# Patient Record
Sex: Female | Born: 1935 | Marital: Married | Smoking: Never smoker
Health system: Southern US, Community
[De-identification: ages and names within clinical notes are randomized; demographics above are authoritative.]

## PROBLEM LIST (undated history)

## (undated) DIAGNOSIS — E039 Hypothyroidism, unspecified: Secondary | ICD-10-CM

## (undated) DIAGNOSIS — I10 Essential (primary) hypertension: Secondary | ICD-10-CM

## (undated) DIAGNOSIS — M179 Osteoarthritis of knee, unspecified: Secondary | ICD-10-CM

## (undated) DIAGNOSIS — E785 Hyperlipidemia, unspecified: Secondary | ICD-10-CM

## (undated) DIAGNOSIS — M1710 Unilateral primary osteoarthritis, unspecified knee: Secondary | ICD-10-CM

## (undated) DIAGNOSIS — Z8619 Personal history of other infectious and parasitic diseases: Secondary | ICD-10-CM

## (undated) DIAGNOSIS — K449 Diaphragmatic hernia without obstruction or gangrene: Secondary | ICD-10-CM

## (undated) DIAGNOSIS — K219 Gastro-esophageal reflux disease without esophagitis: Secondary | ICD-10-CM

## (undated) HISTORY — PX: TUBAL LIGATION: SHX77

## (undated) HISTORY — PX: LAPAROSCOPIC CHOLECYSTECTOMY: SUR755

## (undated) HISTORY — PX: JOINT REPLACEMENT: SHX530

## (undated) HISTORY — PX: TOTAL KNEE ARTHROPLASTY: SHX125

## (undated) HISTORY — DX: Diaphragmatic hernia without obstruction or gangrene: K44.9

## (undated) HISTORY — DX: Gastro-esophageal reflux disease without esophagitis: K21.9

## (undated) HISTORY — PX: OVARIAN CYST REMOVAL: SHX89

## (undated) HISTORY — PX: APPENDECTOMY: SHX54

## (undated) HISTORY — DX: Personal history of other infectious and parasitic diseases: Z86.19

## (undated) HISTORY — DX: Hyperlipidemia, unspecified: E78.5

## (undated) HISTORY — PX: ANKLE SURGERY: SHX546

---

## 2018-07-10 ENCOUNTER — Emergency Department (HOSPITAL_COMMUNITY): Payer: Medicare Other | Admitting: Certified Registered"

## 2018-07-10 ENCOUNTER — Ambulatory Visit (INDEPENDENT_AMBULATORY_CARE_PROVIDER_SITE_OTHER)
Admission: EM | Admit: 2018-07-10 | Discharge: 2018-07-10 | Disposition: A | Discharge status: Home / Self Care | Payer: Medicare Other | Source: Home / Self Care

## 2018-07-10 ENCOUNTER — Encounter (HOSPITAL_COMMUNITY)
Admission: EM | Disposition: A | Discharge status: Rehabilitation Facility | Payer: Self-pay | Source: Home / Self Care | Attending: Neurosurgery

## 2018-07-10 ENCOUNTER — Encounter (HOSPITAL_COMMUNITY): Payer: Self-pay

## 2018-07-10 ENCOUNTER — Inpatient Hospital Stay (HOSPITAL_COMMUNITY)
Admission: EM | Admit: 2018-07-10 | Discharge: 2018-07-14 | DRG: 026 | Disposition: A | Discharge status: Rehabilitation Facility | Payer: Medicare Other | Attending: Neurosurgery | Admitting: Neurosurgery

## 2018-07-10 ENCOUNTER — Other Ambulatory Visit: Payer: Self-pay

## 2018-07-10 ENCOUNTER — Emergency Department (HOSPITAL_COMMUNITY): Payer: Medicare Other

## 2018-07-10 DIAGNOSIS — E876 Hypokalemia: Secondary | ICD-10-CM | POA: Diagnosis present

## 2018-07-10 DIAGNOSIS — S065X0S Traumatic subdural hemorrhage without loss of consciousness, sequela: Secondary | ICD-10-CM | POA: Diagnosis not present

## 2018-07-10 DIAGNOSIS — Z888 Allergy status to other drugs, medicaments and biological substances status: Secondary | ICD-10-CM

## 2018-07-10 DIAGNOSIS — R531 Weakness: Secondary | ICD-10-CM

## 2018-07-10 DIAGNOSIS — D72829 Elevated white blood cell count, unspecified: Secondary | ICD-10-CM | POA: Diagnosis present

## 2018-07-10 DIAGNOSIS — I10 Essential (primary) hypertension: Secondary | ICD-10-CM | POA: Diagnosis present

## 2018-07-10 DIAGNOSIS — G9389 Other specified disorders of brain: Secondary | ICD-10-CM | POA: Diagnosis present

## 2018-07-10 DIAGNOSIS — G8194 Hemiplegia, unspecified affecting left nondominant side: Secondary | ICD-10-CM | POA: Diagnosis present

## 2018-07-10 DIAGNOSIS — E785 Hyperlipidemia, unspecified: Secondary | ICD-10-CM | POA: Diagnosis present

## 2018-07-10 DIAGNOSIS — S065XAA Traumatic subdural hemorrhage with loss of consciousness status unknown, initial encounter: Secondary | ICD-10-CM | POA: Diagnosis present

## 2018-07-10 DIAGNOSIS — R110 Nausea: Secondary | ICD-10-CM | POA: Diagnosis not present

## 2018-07-10 DIAGNOSIS — S065X1S Traumatic subdural hemorrhage with loss of consciousness of 30 minutes or less, sequela: Secondary | ICD-10-CM | POA: Diagnosis not present

## 2018-07-10 DIAGNOSIS — Z7982 Long term (current) use of aspirin: Secondary | ICD-10-CM | POA: Diagnosis not present

## 2018-07-10 DIAGNOSIS — E039 Hypothyroidism, unspecified: Secondary | ICD-10-CM | POA: Diagnosis present

## 2018-07-10 DIAGNOSIS — Z8249 Family history of ischemic heart disease and other diseases of the circulatory system: Secondary | ICD-10-CM

## 2018-07-10 DIAGNOSIS — Z9889 Other specified postprocedural states: Secondary | ICD-10-CM

## 2018-07-10 DIAGNOSIS — S06360A Traumatic hemorrhage of cerebrum, unspecified, without loss of consciousness, initial encounter: Secondary | ICD-10-CM

## 2018-07-10 DIAGNOSIS — R29810 Facial weakness: Secondary | ICD-10-CM | POA: Diagnosis present

## 2018-07-10 DIAGNOSIS — S065X9A Traumatic subdural hemorrhage with loss of consciousness of unspecified duration, initial encounter: Principal | ICD-10-CM | POA: Diagnosis present

## 2018-07-10 DIAGNOSIS — Z79890 Hormone replacement therapy (postmenopausal): Secondary | ICD-10-CM

## 2018-07-10 DIAGNOSIS — Z96653 Presence of artificial knee joint, bilateral: Secondary | ICD-10-CM | POA: Diagnosis present

## 2018-07-10 DIAGNOSIS — R296 Repeated falls: Secondary | ICD-10-CM | POA: Diagnosis present

## 2018-07-10 DIAGNOSIS — D72823 Leukemoid reaction: Secondary | ICD-10-CM | POA: Diagnosis not present

## 2018-07-10 DIAGNOSIS — W19XXXA Unspecified fall, initial encounter: Secondary | ICD-10-CM | POA: Diagnosis present

## 2018-07-10 DIAGNOSIS — Z9181 History of falling: Secondary | ICD-10-CM

## 2018-07-10 DIAGNOSIS — E7800 Pure hypercholesterolemia, unspecified: Secondary | ICD-10-CM | POA: Diagnosis present

## 2018-07-10 HISTORY — PX: CRANIOTOMY: SHX93

## 2018-07-10 HISTORY — DX: Essential (primary) hypertension: I10

## 2018-07-10 HISTORY — DX: Hypothyroidism, unspecified: E03.9

## 2018-07-10 HISTORY — DX: Osteoarthritis of knee, unspecified: M17.9

## 2018-07-10 HISTORY — DX: Unilateral primary osteoarthritis, unspecified knee: M17.10

## 2018-07-10 LAB — COMPREHENSIVE METABOLIC PANEL
ALT: 16 U/L (ref 0–44)
AST: 26 U/L (ref 15–41)
Albumin: 3.8 g/dL (ref 3.5–5.0)
Alkaline Phosphatase: 58 U/L (ref 38–126)
Anion gap: 10 (ref 5–15)
BUN: 11 mg/dL (ref 8–23)
CHLORIDE: 101 mmol/L (ref 98–111)
CO2: 27 mmol/L (ref 22–32)
Calcium: 9.8 mg/dL (ref 8.9–10.3)
Creatinine, Ser: 0.84 mg/dL (ref 0.44–1.00)
GFR calc Af Amer: >60 mL/min (ref >60)
GFR calc non Af Amer: >60 mL/min (ref >60)
GLUCOSE: 138 mg/dL — AB (ref 70–99)
Potassium: 3.4 mmol/L — ABNORMAL LOW (ref 3.5–5.1)
Sodium: 138 mmol/L (ref 135–145)
Total Bilirubin: 0.8 mg/dL (ref 0.3–1.2)
Total Protein: 7.1 g/dL (ref 6.5–8.1)

## 2018-07-10 LAB — CBC
HCT: 45.1 % (ref 36.0–46.0)
HEMOGLOBIN: 14.1 g/dL (ref 12.0–15.0)
MCH: 27.9 pg (ref 26.0–34.0)
MCHC: 31.3 g/dL (ref 30.0–36.0)
MCV: 89.3 fL (ref 80.0–100.0)
NRBC: 0 % (ref 0.0–0.2)
Platelets: 302 10*3/uL (ref 150–400)
RBC: 5.05 MIL/uL (ref 3.87–5.11)
RDW: 14 % (ref 11.5–15.5)
WBC: 12.1 10*3/uL — ABNORMAL HIGH (ref 4.0–10.5)

## 2018-07-10 LAB — I-STAT CHEM 8, ED
BUN: 13 mg/dL (ref 8–23)
CREATININE: 0.8 mg/dL (ref 0.44–1.00)
Calcium, Ion: 1.3 mmol/L (ref 1.15–1.40)
Chloride: 100 mmol/L (ref 98–111)
Glucose, Bld: 131 mg/dL — ABNORMAL HIGH (ref 70–99)
HEMATOCRIT: 46 % (ref 36.0–46.0)
Hemoglobin: 15.6 g/dL — ABNORMAL HIGH (ref 12.0–15.0)
Potassium: 3.4 mmol/L — ABNORMAL LOW (ref 3.5–5.1)
Sodium: 138 mmol/L (ref 135–145)
TCO2: 29 mmol/L (ref 22–32)

## 2018-07-10 LAB — PROTIME-INR
INR: 1
Prothrombin Time: 13.1 seconds (ref 11.4–15.2)

## 2018-07-10 LAB — DIFFERENTIAL
ABS IMMATURE GRANULOCYTES: 0.09 10*3/uL — AB (ref 0.00–0.07)
Basophils Absolute: 0 10*3/uL (ref 0.0–0.1)
Basophils Relative: 0 %
Eosinophils Absolute: 0.1 10*3/uL (ref 0.0–0.5)
Eosinophils Relative: 0 %
Immature Granulocytes: 1 %
Lymphocytes Relative: 25 %
Lymphs Abs: 3 10*3/uL (ref 0.7–4.0)
Monocytes Absolute: 0.8 10*3/uL (ref 0.1–1.0)
Monocytes Relative: 7 %
NEUTROS ABS: 8.1 10*3/uL — AB (ref 1.7–7.7)
Neutrophils Relative %: 67 %

## 2018-07-10 LAB — TYPE AND SCREEN
ABO/RH(D): A POS
Antibody Screen: NEGATIVE

## 2018-07-10 LAB — ABO/RH: ABO/RH(D): A POS

## 2018-07-10 LAB — APTT: aPTT: 27 seconds (ref 24–36)

## 2018-07-10 LAB — I-STAT TROPONIN, ED: Troponin i, poc: 0 ng/mL (ref 0.00–0.08)

## 2018-07-10 SURGERY — CRANIOTOMY HEMATOMA EVACUATION SUBDURAL
Anesthesia: General | Site: Head | Laterality: Right

## 2018-07-10 MED ORDER — ACETAMINOPHEN 10 MG/ML IV SOLN
INTRAVENOUS | Status: DC | PRN
  Administered 2018-07-10: 1000 mg via INTRAVENOUS

## 2018-07-10 MED ORDER — ONDANSETRON HCL 4 MG/2ML IJ SOLN
INTRAMUSCULAR | Status: AC
  Filled 2018-07-10: qty 2

## 2018-07-10 MED ORDER — LIDOCAINE-EPINEPHRINE 1 %-1:100000 IJ SOLN
INTRAMUSCULAR | Status: AC
  Filled 2018-07-10: qty 1

## 2018-07-10 MED ORDER — THROMBIN 20000 UNITS EX SOLR
CUTANEOUS | Status: AC
  Filled 2018-07-10: qty 20000

## 2018-07-10 MED ORDER — VITAMIN D 25 MCG (1000 UNIT) PO TABS
2000.0000 [IU] | ORAL_TABLET | Freq: Every day | ORAL | Status: DC
  Administered 2018-07-11 – 2018-07-14 (×4): 2000 [IU] via ORAL
  Filled 2018-07-10 (×4): qty 2

## 2018-07-10 MED ORDER — SUGAMMADEX SODIUM 200 MG/2ML IV SOLN
INTRAVENOUS | Status: DC | PRN
  Administered 2018-07-10: 200 mg via INTRAVENOUS

## 2018-07-10 MED ORDER — PHENYLEPHRINE 40 MCG/ML (10ML) SYRINGE FOR IV PUSH (FOR BLOOD PRESSURE SUPPORT)
PREFILLED_SYRINGE | INTRAVENOUS | Status: AC
  Filled 2018-07-10: qty 10

## 2018-07-10 MED ORDER — FENTANYL CITRATE (PF) 100 MCG/2ML IJ SOLN
INTRAMUSCULAR | Status: DC | PRN
  Administered 2018-07-10 (×2): 50 ug via INTRAVENOUS

## 2018-07-10 MED ORDER — LIDOCAINE-EPINEPHRINE 1 %-1:100000 IJ SOLN
INTRAMUSCULAR | Status: DC | PRN
  Administered 2018-07-10: 6 mL via INTRADERMAL
  Administered 2018-07-10: 20 mL via INTRADERMAL

## 2018-07-10 MED ORDER — SODIUM CHLORIDE 0.9 % IV SOLN
INTRAVENOUS | Status: DC | PRN
  Administered 2018-07-10: 500 mL

## 2018-07-10 MED ORDER — ROCURONIUM BROMIDE 50 MG/5ML IV SOSY
PREFILLED_SYRINGE | INTRAVENOUS | Status: AC
  Filled 2018-07-10: qty 5

## 2018-07-10 MED ORDER — SUGAMMADEX SODIUM 500 MG/5ML IV SOLN
INTRAVENOUS | Status: AC
  Filled 2018-07-10: qty 5

## 2018-07-10 MED ORDER — LIDOCAINE 2% (20 MG/ML) 5 ML SYRINGE
INTRAMUSCULAR | Status: AC
  Filled 2018-07-10: qty 5

## 2018-07-10 MED ORDER — ACETAMINOPHEN 10 MG/ML IV SOLN
INTRAVENOUS | Status: AC
  Filled 2018-07-10: qty 100

## 2018-07-10 MED ORDER — FENTANYL CITRATE (PF) 250 MCG/5ML IJ SOLN
INTRAMUSCULAR | Status: AC
  Filled 2018-07-10: qty 5

## 2018-07-10 MED ORDER — THROMBIN 20000 UNITS EX SOLR
CUTANEOUS | Status: DC | PRN
  Administered 2018-07-10: 20 mL via TOPICAL

## 2018-07-10 MED ORDER — SODIUM CHLORIDE 0.9 % IV SOLN
INTRAVENOUS | Status: DC | PRN
  Administered 2018-07-10: 22:00:00 via INTRAVENOUS

## 2018-07-10 MED ORDER — ROCURONIUM BROMIDE 50 MG/5ML IV SOSY
PREFILLED_SYRINGE | INTRAVENOUS | Status: DC | PRN
  Administered 2018-07-10: 50 mg via INTRAVENOUS

## 2018-07-10 MED ORDER — LIDOCAINE 2% (20 MG/ML) 5 ML SYRINGE
INTRAMUSCULAR | Status: DC | PRN
  Administered 2018-07-10: 60 mg via INTRAVENOUS

## 2018-07-10 MED ORDER — CLEVIDIPINE BUTYRATE 0.5 MG/ML IV EMUL
0.0000 mg/h | INTRAVENOUS | Status: DC
  Administered 2018-07-10: 1 mg/h via INTRAVENOUS
  Filled 2018-07-10: qty 50

## 2018-07-10 MED ORDER — PHENYLEPHRINE HCL 10 MG/ML IJ SOLN
INTRAMUSCULAR | Status: DC | PRN
  Administered 2018-07-10 (×2): 80 ug via INTRAVENOUS
  Administered 2018-07-10: 40 ug via INTRAVENOUS

## 2018-07-10 MED ORDER — ONDANSETRON HCL 4 MG/2ML IJ SOLN
INTRAMUSCULAR | Status: DC | PRN
  Administered 2018-07-10: 4 mg via INTRAVENOUS

## 2018-07-10 MED ORDER — POVIDONE-IODINE 10 % EX OINT
TOPICAL_OINTMENT | CUTANEOUS | Status: AC
  Filled 2018-07-10: qty 28.35

## 2018-07-10 MED ORDER — PROPOFOL 10 MG/ML IV BOLUS
INTRAVENOUS | Status: AC
  Filled 2018-07-10: qty 20

## 2018-07-10 MED ORDER — 0.9 % SODIUM CHLORIDE (POUR BTL) OPTIME
TOPICAL | Status: DC | PRN
  Administered 2018-07-10 (×2): 1000 mL

## 2018-07-10 MED ORDER — BUPIVACAINE HCL (PF) 0.25 % IJ SOLN
INTRAMUSCULAR | Status: AC
  Filled 2018-07-10: qty 30

## 2018-07-10 MED ORDER — SODIUM CHLORIDE 0.9 % IV SOLN
2.0000 g | Freq: Once | INTRAVENOUS | Status: AC
  Administered 2018-07-10: 2 g via INTRAVENOUS
  Filled 2018-07-10: qty 20

## 2018-07-10 MED ORDER — MICROFIBRILLAR COLL HEMOSTAT EX PADS
MEDICATED_PAD | CUTANEOUS | Status: DC | PRN
  Administered 2018-07-10: 1 via TOPICAL

## 2018-07-10 MED ORDER — THROMBIN 5000 UNITS EX SOLR
CUTANEOUS | Status: AC
  Filled 2018-07-10: qty 5000

## 2018-07-10 MED ORDER — METHYLENE BLUE 0.5 % INJ SOLN
INTRAVENOUS | Status: AC
  Filled 2018-07-10: qty 10

## 2018-07-10 MED ORDER — LEVOTHYROXINE SODIUM 50 MCG PO TABS
50.0000 ug | ORAL_TABLET | Freq: Every day | ORAL | Status: DC
  Administered 2018-07-11 – 2018-07-14 (×4): 50 ug via ORAL
  Filled 2018-07-10 (×4): qty 1

## 2018-07-10 MED ORDER — METHYLENE BLUE 0.5 % INJ SOLN
INTRAVENOUS | Status: DC | PRN
  Administered 2018-07-10: 1 mL

## 2018-07-10 MED ORDER — PROPOFOL 10 MG/ML IV BOLUS
INTRAVENOUS | Status: DC | PRN
  Administered 2018-07-10: 100 mg via INTRAVENOUS

## 2018-07-10 MED ORDER — SIMVASTATIN 20 MG PO TABS
20.0000 mg | ORAL_TABLET | Freq: Every day | ORAL | Status: DC
  Administered 2018-07-11 – 2018-07-14 (×4): 20 mg via ORAL
  Filled 2018-07-10 (×4): qty 1

## 2018-07-10 MED ORDER — LISINOPRIL 10 MG PO TABS
10.0000 mg | ORAL_TABLET | Freq: Every day | ORAL | Status: DC
  Administered 2018-07-11 – 2018-07-14 (×4): 10 mg via ORAL
  Filled 2018-07-10 (×4): qty 1

## 2018-07-10 SURGICAL SUPPLY — 75 items
APPLICATOR COTTON TIP 6IN STRL (MISCELLANEOUS) ×2 IMPLANT
BAG DECANTER FOR FLEXI CONT (MISCELLANEOUS) ×2 IMPLANT
BANDAGE GAUZE 4  KLING STR (GAUZE/BANDAGES/DRESSINGS) IMPLANT
BIT DRILL WIRE PASS 1.3MM (BIT) ×1 IMPLANT
BNDG GAUZE ELAST 4 BULKY (GAUZE/BANDAGES/DRESSINGS) ×4 IMPLANT
BNDG STRETCH 4X75 NS LF (GAUZE/BANDAGES/DRESSINGS) ×2 IMPLANT
BUR ACORN 6.0 PRECISION (BURR) ×2 IMPLANT
BUR SPIRAL ROUTER 2.3 (BUR) ×2 IMPLANT
CANISTER SUCT 3000ML PPV (MISCELLANEOUS) ×2 IMPLANT
CARTRIDGE OIL MAESTRO DRILL (MISCELLANEOUS) ×1 IMPLANT
CLIP VESOCCLUDE MED 6/CT (CLIP) ×2 IMPLANT
COVER WAND RF STERILE (DRAPES) ×2 IMPLANT
DIFFUSER DRILL AIR PNEUMATIC (MISCELLANEOUS) ×2 IMPLANT
DRAIN JACKSON PRATT 10MM FLAT (MISCELLANEOUS) ×2 IMPLANT
DRAIN PENROSE 1/2X12 LTX STRL (WOUND CARE) IMPLANT
DRAPE NEUROLOGICAL W/INCISE (DRAPES) ×2 IMPLANT
DRAPE SURG 17X23 STRL (DRAPES) IMPLANT
DRAPE WARM FLUID 44X44 (DRAPE) ×2 IMPLANT
DRILL WIRE PASS 1.3MM (BIT) ×2
DRSG ADAPTIC 3X8 NADH LF (GAUZE/BANDAGES/DRESSINGS) ×2 IMPLANT
DRSG PAD ABDOMINAL 8X10 ST (GAUZE/BANDAGES/DRESSINGS) IMPLANT
ELECT CAUTERY BLADE 6.4 (BLADE) ×2 IMPLANT
ELECT REM PT RETURN 9FT ADLT (ELECTROSURGICAL) ×2
ELECTRODE REM PT RTRN 9FT ADLT (ELECTROSURGICAL) ×1 IMPLANT
EVACUATOR SILICONE 100CC (DRAIN) ×2 IMPLANT
GAUZE 4X4 16PLY RFD (DISPOSABLE) IMPLANT
GAUZE SPONGE 4X4 12PLY STRL (GAUZE/BANDAGES/DRESSINGS) ×4 IMPLANT
GLOVE BIO SURGEON STRL SZ7 (GLOVE) ×2 IMPLANT
GLOVE BIOGEL PI IND STRL 7.0 (GLOVE) ×2 IMPLANT
GLOVE BIOGEL PI IND STRL 7.5 (GLOVE) ×1 IMPLANT
GLOVE BIOGEL PI IND STRL 8 (GLOVE) ×1 IMPLANT
GLOVE BIOGEL PI INDICATOR 7.0 (GLOVE) ×2
GLOVE BIOGEL PI INDICATOR 7.5 (GLOVE) ×1
GLOVE BIOGEL PI INDICATOR 8 (GLOVE) ×1
GLOVE ECLIPSE 7.5 STRL STRAW (GLOVE) ×2 IMPLANT
GLOVE EXAM NITRILE XL STR (GLOVE) IMPLANT
GOWN STRL REUS W/ TWL LRG LVL3 (GOWN DISPOSABLE) ×2 IMPLANT
GOWN STRL REUS W/ TWL XL LVL3 (GOWN DISPOSABLE) IMPLANT
GOWN STRL REUS W/TWL 2XL LVL3 (GOWN DISPOSABLE) IMPLANT
GOWN STRL REUS W/TWL LRG LVL3 (GOWN DISPOSABLE) ×2
GOWN STRL REUS W/TWL XL LVL3 (GOWN DISPOSABLE)
GRAFT DURAGEN MATRIX 2WX2L ×2 IMPLANT
HEMOSTAT SURGICEL 2X14 (HEMOSTASIS) ×2 IMPLANT
KIT BASIN OR (CUSTOM PROCEDURE TRAY) ×2 IMPLANT
KIT TURNOVER KIT B (KITS) ×2 IMPLANT
NEEDLE SPNL 22GX3.5 QUINCKE BK (NEEDLE) ×2 IMPLANT
NS IRRIG 1000ML POUR BTL (IV SOLUTION) ×2 IMPLANT
OIL CARTRIDGE MAESTRO DRILL (MISCELLANEOUS) ×2
PACK CRANIOTOMY CUSTOM (CUSTOM PROCEDURE TRAY) ×2 IMPLANT
PAD ARMBOARD 7.5X6 YLW CONV (MISCELLANEOUS) ×2 IMPLANT
PATTIES SURGICAL .5 X.5 (GAUZE/BANDAGES/DRESSINGS) IMPLANT
PATTIES SURGICAL .5 X3 (DISPOSABLE) IMPLANT
PATTIES SURGICAL 1/4 X 3 (GAUZE/BANDAGES/DRESSINGS) ×2 IMPLANT
PATTIES SURGICAL 1X1 (DISPOSABLE) IMPLANT
PIN MAYFIELD SKULL DISP (PIN) IMPLANT
PLATE 1.5  2HOLE MED NEURO (Plate) ×1 IMPLANT
PLATE 1.5 2HOLE MED NEURO (Plate) ×1 IMPLANT
PLATE 1.5 5HOLE SQUARE (Plate) ×2 IMPLANT
PLATE 1.5/0.5 13MM BURR HOLE (Plate) ×2 IMPLANT
SCREW SELF DRILL HT 1.5/4MM (Screw) ×22 IMPLANT
SPECIMEN JAR SMALL (MISCELLANEOUS) IMPLANT
SPONGE NEURO XRAY DETECT 1X3 (DISPOSABLE) IMPLANT
SPONGE SURGIFOAM ABS GEL 100 (HEMOSTASIS) ×2 IMPLANT
STAPLER SKIN PROX WIDE 3.9 (STAPLE) ×2 IMPLANT
SUT ETHILON 3 0 FSL (SUTURE) IMPLANT
SUT NURALON 4 0 TR CR/8 (SUTURE) ×4 IMPLANT
SUT VIC AB 2-0 CP2 18 (SUTURE) ×4 IMPLANT
SYR CONTROL 10ML LL (SYRINGE) ×2 IMPLANT
TAPE CLOTH 2X10 TAN LF (GAUZE/BANDAGES/DRESSINGS) ×2 IMPLANT
TOWEL GREEN STERILE (TOWEL DISPOSABLE) ×2 IMPLANT
TOWEL GREEN STERILE FF (TOWEL DISPOSABLE) ×2 IMPLANT
TRAP SPECIMEN MUCOUS 40CC (MISCELLANEOUS) IMPLANT
TRAY FOLEY MTR SLVR 16FR STAT (SET/KITS/TRAYS/PACK) IMPLANT
UNDERPAD 30X30 (UNDERPADS AND DIAPERS) IMPLANT
WATER STERILE IRR 1000ML POUR (IV SOLUTION) ×2 IMPLANT

## 2018-07-10 NOTE — Anesthesia Preprocedure Evaluation (Addendum)
Anesthesia Evaluation  Patient identified by MRN, date of birth, ID band Patient awake    Reviewed: Allergy & Precautions, NPO status , Patient's Chart, lab work & pertinent test results  History of Anesthesia Complications Negative for: history of anesthetic complications  Airway Mallampati: II  TM Distance: >3 FB Neck ROM: Full    Dental  (+) Upper Dentures, Lower Dentures   Pulmonary neg pulmonary ROS,    breath sounds clear to auscultation       Cardiovascular negative cardio ROS   Rhythm:Regular     Neuro/Psych subdural CVA negative psych ROS   GI/Hepatic negative GI ROS, Neg liver ROS,   Endo/Other  Hypothyroidism   Renal/GU negative Renal ROS     Musculoskeletal negative musculoskeletal ROS (+)   Abdominal   Peds  Hematology negative hematology ROS (+)   Anesthesia Other Findings   Reproductive/Obstetrics                            Anesthesia Physical Anesthesia Plan  ASA: II  Anesthesia Plan: General   Post-op Pain Management:    Induction: Intravenous  PONV Risk Score and Plan: 3 and Ondansetron and Dexamethasone  Airway Management Planned: Oral ETT  Additional Equipment: None  Intra-op Plan:   Post-operative Plan: Extubation in OR  Informed Consent: I have reviewed the patients History and Physical, chart, labs and discussed the procedure including the risks, benefits and alternatives for the proposed anesthesia with the patient or authorized representative who has indicated his/her understanding and acceptance.   Dental advisory given  Plan Discussed with: CRNA and Surgeon  Anesthesia Plan Comments:         Anesthesia Quick Evaluation

## 2018-07-10 NOTE — ED Notes (Addendum)
Per Dahlia Byesraci Bast recommendation, patient will be taken to the ER by family members. Family and patient verbalize understanding. NAD during discharge. Pt and daughter is refusing transportation by EMS and insist's on family member taking them to the ER.

## 2018-07-10 NOTE — Op Note (Signed)
07/10/2018  11:51 PM  PATIENT:  Dana Porter  84 y.o. female  PRE-OPERATIVE DIAGNOSIS: Right frontoparietal chronic subdural hematoma  POST-OPERATIVE DIAGNOSIS: Right frontoparietal chronic/subacute subdural hematoma  PROCEDURE:  Procedure(s): Right frontoparietal craniotomy and evacuation of subdural hematoma   SURGEON:  Surgeon(s): Shirlean KellyNudelman, , MD  ANESTHESIA:   general  EBL:  Total I/O In: 1900 [I.V.:1900] Out: 400 [Urine:350; Blood:50]  COUNT:  Correct per nursing staff  DRAINS: 10 mm wide flat [name] in the subdural space  DICTATION:   Patient was brought the operating room, placed under general endotracheal anesthesia.  The right frontoparietal region was shaved with surgical clippers and the head was positioned on a doughnut headrest with a roll behind the right shoulder and the head and neck gently turned towards the left.  The scalp was prepped with Betadine soap and solution draped in a sterile fashion.  A right parasagittal incision was made extending from the frontal boss to the parietal boss.  The line of incision was infiltrated local anesthetic with epinephrine.  Raney clips were applied to the scalp edges to maintain hemostasis.  Temporalis fascia was incised and the temporalis muscle mobilized inferiorly.  2 bur holes were made.  The edge of the bone were waxed as necessary.  The dura was dissected from the overlying skull and then using the craniotome attachment a right frontoparietal bone flap was turned and elevated.  The dura was tacked up to the margins of the craniotomy with 4-0 Nurolon sutures.  The anterior portion of the dura remained adherent to the skull but the subdural membrane was intact.  We opened the remainder of the dura in a U-shaped fashion hinge superiorly.  We then opened the subdural membrane, liquid chronic subdural hematoma drained, and the edge of the subdural membrane was clipped to the edges of the dura with hemoclips.  The  subdural space then irrigated with warm saline to remove the soft chronic/subacute clot that remained.  Continue to irrigate the subdural space until the irrigation ran clear.  We then gently incised the visceral subdural membrane, it was irrigated off the brain surface with warm saline, and then open thoroughly.  The edges of the subdural membrane were coagulated.  We then again irrigated the subdural space extensive with warm saline solution until clear.  A flat [name] was placed in the subdural space and brought out anterior inferiorly through a small scalp incision.  It was sutured to the scalp with a 3-0 nylon suture.  A closed collection system was attached.  A 2 x 2 pledget of DuraGen was placed over the dural defect.  We then secured the bone flap to the skull with 3 Lorenz cranial plates and screws.  We then closed the scalp in layers.  The galea was closed with interrupted inverted 2-0 and 3-0 undyed Vicryl sutures.  The skin was approximated with surgical staples.  The wound was dressed with Adaptic and sterile gauze.  The dressing was wrapped with 2 Kerlix and a Kling.  Following surgery the patient is to be reversed in anesthetic, extubated, and transferred to the recovery room for further care.  PLAN OF CARE: Admit to inpatient   PATIENT DISPOSITION:  PACU - hemodynamically stable.   Delay start of Pharmacological VTE agent (>24hrs) due to surgical blood loss or risk of bleeding:  yes

## 2018-07-10 NOTE — ED Triage Notes (Signed)
Pt here for recurrent falls and left sided weakness.  Noted left arm weakness and no facial asymmetry.  A&Ox4.  Symptoms have been  There since Saturday.

## 2018-07-10 NOTE — Transfer of Care (Signed)
Immediate Anesthesia Transfer of Care Note  Patient: Dana Porter  Procedure(s) Performed: CRANIOTOMY HEMATOMA EVACUATION SUBDURAL (Right Head)  Patient Location: PACU  Anesthesia Type:General  Level of Consciousness: awake, alert  and patient cooperative  Airway & Oxygen Therapy: Patient Spontanous Breathing  Post-op Assessment: Report given to RN and Post -op Vital signs reviewed and stable  Post vital signs: Reviewed and stable  Last Vitals:  Vitals Value Taken Time  BP    Temp    Pulse 76 07/10/2018 11:54 PM  Resp 8 07/10/2018 11:54 PM  SpO2 94 % 07/10/2018 11:54 PM  Vitals shown include unvalidated device data.  Last Pain:  Vitals:   07/10/18 1713  TempSrc:   PainSc: 0-No pain         Complications: No apparent anesthesia complications

## 2018-07-10 NOTE — Anesthesia Procedure Notes (Signed)
Procedure Name: Intubation Date/Time: 07/10/2018 9:55 PM Performed by: Sheppard EvensManess, Finnegan Gatta B, CRNA Pre-anesthesia Checklist: Patient identified, Emergency Drugs available, Suction available and Patient being monitored Patient Re-evaluated:Patient Re-evaluated prior to induction Oxygen Delivery Method: Circle System Utilized Preoxygenation: Pre-oxygenation with 100% oxygen Induction Type: IV induction Ventilation: Mask ventilation without difficulty Tube type: Oral Tube size: 7.5 mm Number of attempts: 1 Airway Equipment and Method: Stylet and Oral airway Placement Confirmation: ETT inserted through vocal cords under direct vision,  positive ETCO2 and breath sounds checked- equal and bilateral Secured at: 20 cm Tube secured with: Tape Dental Injury: Teeth and Oropharynx as per pre-operative assessment

## 2018-07-10 NOTE — ED Provider Notes (Signed)
[facility] EMERGENCY DEPARTMENT Provider Note   CSN: [id number] Arrival date & time: 07/10/18  1557     History   Chief Complaint Chief Complaint  Patient presents with  . Weakness  . Fall    HPI Dana Porter is a 84 y.o. female.  The history is provided by the patient and medical records. No language interpreter was used.  Weakness   Fall    Dana Porter is a 84 y.o. female  with a PMH of HTN, HLD, thyroid disorder who presents to the Emergency Department complaining of left-sided weakness and falls.  Patient states that she initially fell on Saturday.  She states that this was not mechanical, she felt very weak and unsteady as if she could not walk.  She fell.  She denies hitting her head.  Since the fall however, she has had trouble gripping objects and keeps dropping things with her left hand.  She does not feel as if she is having a hard time walking, but her family states that she has been dragging her left foot.  Because of this, she has had 2 subsequent falls.  She is not on any anticoagulation.  History reviewed. No pertinent past medical history.  There are no active problems to display for this patient.   History reviewed. No pertinent surgical history.   OB History   No obstetric history on file.      Home Medications    Prior to Admission medications   Medication Sig Start Date End Date Taking? Authorizing Provider  aspirin 81 MG chewable tablet Chew 162 mg by mouth once as needed (for possible heart-related symptoms).   Yes [provider]  Calcium-Magnesium-Vitamin D (CALCIUM 500 PO) Take 500 mg by mouth daily.   Yes [provider]  Cholecalciferol (VITAMIN D-3) 25 MCG (1000 UT) CAPS Take 2,000 Units by mouth daily with lunch.   Yes [provider]  Garlic 200 MG TABS Take 400 mg by mouth daily with lunch.   Yes [provider]  levothyroxine (SYNTHROID, LEVOTHROID) 50 MCG tablet Take 50 mcg  by mouth daily before breakfast.   Yes [provider]  Magnesium 250 MG TABS Take 250 mg by mouth at bedtime.   Yes [provider]  Multiple Vitamins-Minerals (MULTIVITAMIN WITH MINERALS) tablet Take 1 tablet by mouth daily.   Yes [provider]  Omega-3 Fatty Acids (FISH OIL) 1000 MG CAPS Take 1,000 mg by mouth daily after lunch.   Yes [provider]  quinapril (ACCUPRIL) 10 MG tablet Take 10 mg by mouth at bedtime.   Yes [provider]  simvastatin (ZOCOR) 20 MG tablet Take 20 mg by mouth daily.    [provider]    Family History History reviewed. No pertinent family history.  Social History Social History   Tobacco Use  . Smoking status: Never Smoker  . Smokeless tobacco: Never Used  Substance Use Topics  . Alcohol use: Not on file  . Drug use: Not on file     Allergies   Dyazide [hydrochlorothiazide w-triamterene]   Review of Systems Review of Systems  Neurological: Positive for weakness.  All other systems reviewed and are negative.    Physical Exam Updated Vital Signs BP (!) 141/109   Pulse 75   Temp 98.6 F (37 C) (Oral)   Resp 16   SpO2 94%   Physical Exam Vitals signs and nursing note reviewed.  Constitutional:      General: She  is not in acute distress.    Appearance: She is well-developed.  HENT:     Head: Normocephalic and atraumatic.  Cardiovascular:     Rate and Rhythm: Normal rate and regular rhythm.     Heart sounds: Normal heart sounds. No murmur.  Pulmonary:     Effort: Pulmonary effort is normal. No respiratory distress.     Breath sounds: Normal breath sounds.  Abdominal:     General: There is no distension.     Palpations: Abdomen is soft.     Tenderness: There is no abdominal tenderness.  Musculoskeletal:     Comments: Decreased strength to left upper and lower extremity.  Skin:    General: Skin is warm and dry.  Neurological:     Mental Status: She is alert and oriented  to person, place, and time.     Comments: Facial droop noted.      ED Treatments / Results  Labs (all labs ordered are listed, but only abnormal results are displayed) Labs Reviewed  CBC - Abnormal; Notable for the following components:      Result Value   WBC 12.1 (*)    All other components within normal limits  DIFFERENTIAL - Abnormal; Notable for the following components:   Neutro Abs 8.1 (*)    Abs Immature Granulocytes 0.09 (*)    All other components within normal limits  COMPREHENSIVE METABOLIC PANEL - Abnormal; Notable for the following components:   Potassium 3.4 (*)    Glucose, Bld 138 (*)    All other components within normal limits  I-STAT CHEM 8, ED - Abnormal; Notable for the following components:   Potassium 3.4 (*)    Glucose, Bld 131 (*)    Hemoglobin 15.6 (*)    All other components within normal limits  PROTIME-INR  APTT  I-STAT TROPONIN, ED  TYPE AND SCREEN    EKG None  Radiology Ct Head Wo Contrast  Result Date: 07/10/2018 CLINICAL DATA:  84 y/o F; multiple falls over the last 3 days. Left-sided weakness. EXAM: CT HEAD WITHOUT CONTRAST TECHNIQUE: Contiguous axial images were obtained from the base of the skull through the vertex without intravenous contrast. COMPARISON:  None. FINDINGS: Brain: Large extra-axial hemorrhage over the right cerebral convexity, predominantly low in attenuation, but with several high attenuation foci likely representing interval acute hemorrhage and/or retracted clot. The hematoma measures up to 30 mm in thickness. There is associated mass effect on the right cerebral hemisphere with effacement of right lateral ventricle and 11 mm of right-to-left midline shift at the level of septum pellucidum. The hematoma has a somewhat lentiform appearance, but there is no associated calvarial fracture, and is it is probably subdural. No stroke, focal mass effect of brain parenchyma, intraventricular hemorrhage, hydrocephalus, or additional  focus of extra-axial hemorrhage is identified. Vascular: Calcific atherosclerosis of the carotid siphons. No hyperdense vessel identified. Skull: Normal. Negative for fracture or focal lesion. Sinuses/Orbits: No acute finding. Other: None. IMPRESSION: 1. Large mixed attenuation extra-axial hemorrhage over the right cerebral convexity. The hemorrhage measures up to 30 mm in thickness and results in mass effect with 11 mm of right-to-left midline shift. No herniation at this time. 2. No calvarial fracture or brain parenchymal hemorrhage identified. Critical Value/emergent results were called by telephone at the time of interpretation on 07/10/2018 at 6:45 pm to Dr. Adela LankFloyd, who verbally acknowledged these results. Electronically Signed   By: Mitzi HansenLance  Furusawa-Stratton M.D.   On: 07/10/2018 18:46    Procedures Procedures (including  critical care time)  CRITICAL CARE Performed by: Chase PicketJaime Pilcher Ward   Total critical care time: 35 minutes  Critical care time was exclusive of separately billable procedures and treating other patients.  Critical care was necessary to treat or prevent imminent or life-threatening deterioration.  Critical care was time spent personally by me on the following activities: development of treatment plan with patient and/or surrogate as well as nursing, discussions with consultants, evaluation of patient's response to treatment, examination of patient, obtaining history from patient or surrogate, ordering and performing treatments and interventions, ordering and review of laboratory studies, ordering and review of radiographic studies, pulse oximetry and re-evaluation of patient's condition.   Medications Ordered in ED Medications  clevidipine (CLEVIPREX) infusion 0.5 mg/mL (0 mg/hr Intravenous Transfusing/Transfer 07/10/18 2121)     Initial Impression / Assessment and Plan / ED Course  I have reviewed the triage vital signs and the nursing notes.  Pertinent labs & imaging  results that were available during my care of the patient were reviewed by me and considered in my medical decision making (see chart for details).    Tobie LordsJosephine Dials is a [age] y.o. female who presents to ED from urgent care for left-sided weakness and frequent falls.  Exam with left-sided upper and lower extremity weakness and facial droop.  CT head shows large extra-axial hemorrhage with mass-effect.  Neurosurgery was consulted who will admit.  Patient seen by and discussed with Dr. Adela LankFloyd who agrees with treatment plan.    Final Clinical Impressions(s) / ED Diagnoses   Final diagnoses:  Traumatic hemorrhage of cerebrum without loss of consciousness, unspecified laterality, initial encounter [facility](HCC)    ED Discharge Orders    None       Ward, Chase PicketJaime Pilcher, PA-C 07/10/18 2126    Melene PlanFloyd, Dan, DO 07/10/18 2322

## 2018-07-10 NOTE — ED Triage Notes (Addendum)
Pt presents with frequent falls. Reports falling 3 times in 3 days.  Pt reports feeling generally weak. Denies any head injury or LOC. Pt is alert and oriented. Complains of pain in left hip.  Pt and family reports that the patient has been dropping things out of her left arm and having left sided arm weakness since Saturday.   Pt reports she feels unstable and weak on both sides.

## 2018-07-10 NOTE — ED Provider Notes (Signed)
[name]    CSN: [id number] Arrival date & time: 07/10/18  1211     History   Chief Complaint Chief Complaint  Patient presents with  . Fall    HPI Dana Porter is a 84 y.o. female.   Patient is an 84 year old female who presents with left-sided weakness, falls.  This is been present since Saturday.  First fall was on Saturday when she felt weak.  She needed assistance to get back up out of the bathroom.  She denies hitting her head in the fall.  Since patient has been having left-sided weakness and trouble gripping objects and dropping things.  She is having a hard time walking and keeps falling due to weakness on the left side.  She is also having right hip pain due to the fall.  Denies any associated numbness, tingling, chest pain, shortness of breath, palpitations.  Denies any dizziness, headaches, blurred vision.  Patient has been under a lot of stress recently with recent death of brother.   ROS per HPI    Fall     History reviewed. No pertinent past medical history.  There are no active problems to display for this patient.   History reviewed. No pertinent surgical history.  OB History   No obstetric history on file.      Home Medications    Prior to Admission medications   Medication Sig Start Date End Date Taking? Authorizing Provider  levothyroxine (SYNTHROID, LEVOTHROID) 50 MCG tablet Take 50 mcg by mouth daily before breakfast.   Yes [provider]  magnesium 30 MG tablet Take 30 mg by mouth 2 (two) times daily.   Yes [provider]  Multiple Vitamins-Minerals (MULTIVITAMIN WITH MINERALS) tablet Take 1 tablet by mouth daily.   Yes [provider]  simvastatin (ZOCOR) 20 MG tablet Take 20 mg by mouth daily.   Yes [provider]  UNABLE TO FIND garlic   Yes [provider]  UNABLE TO FIND A blood pressure medication unknown name   Yes [provider]    Family History History  reviewed. No pertinent family history.  Social History Social History   Tobacco Use  . Smoking status: Never Smoker  . Smokeless tobacco: Never Used  Substance Use Topics  . Alcohol use: Not on file  . Drug use: Not on file     Allergies   Other   Review of Systems Review of Systems   Physical Exam Triage Vital Signs ED Triage Vitals  Enc Vitals Group     BP 07/10/18 1416 (!) 156/86     Pulse Rate 07/10/18 1416 82     Resp 07/10/18 1416 20     Temp 07/10/18 1416 (!) 97.5 F (36.4 C)     Temp src --      SpO2 07/10/18 1416 96 %     Weight --      Height --      Head Circumference --      Peak Flow --      Pain Score 07/10/18 1412 0     Pain Loc --      Pain Edu? --      Excl. in GC? --    No data found.  Updated Vital Signs BP (!) 156/86   Pulse 82   Temp (!) 97.5 F (36.4 C)   Resp 20   SpO2 96%   Visual Acuity Right Eye Distance:   Left Eye Distance:   Bilateral  Distance:    Right Eye Near:   Left Eye Near:    Bilateral Near:     Physical Exam Vitals signs and nursing note reviewed.  HENT:     Head: Normocephalic and atraumatic.     Nose: Nose normal.  Eyes:     Conjunctiva/sclera: Conjunctivae normal.  Neck:     Musculoskeletal: Normal range of motion.  Cardiovascular:     Rate and Rhythm: Normal rate and regular rhythm.     Heart sounds: Normal heart sounds.  Pulmonary:     Effort: Pulmonary effort is normal.     Breath sounds: Normal breath sounds.  Musculoskeletal: Normal range of motion.  Skin:    General: Skin is warm and dry.  Neurological:     Mental Status: She is alert and oriented to person, place, and time.     Motor: Weakness present.     Coordination: Coordination abnormal.     Gait: Gait abnormal.     Comments: Mild left sided facial droop.  Left arm and leg drift.  Mild slurred speech. Pt slightly slumped in chair leaning to the left.  Grip strength decreased on the left.  Mildly confused at times and rambling.     Psychiatric:        Mood and Affect: Mood normal.      UC Treatments / Results  Labs (all labs ordered are listed, but only abnormal results are displayed) Labs Reviewed - No data to display  EKG None  Radiology No results found.  Procedures Procedures (including critical care time)  Medications Ordered in UC Medications - No data to display  Initial Impression / Assessment and Plan / UC Course  I have reviewed the triage vital signs and the nursing notes.  Pertinent labs & imaging results that were available during my care of the patient were reviewed by me and considered in my medical decision making (see chart for details).     Worried pt has had a CVA, possibly Saturday when the symptoms started.  No code stroke.  Sent to the ER for further evaluation.  Pt understanding and agreed  Final Clinical Impressions(s) / UC Diagnoses   Final diagnoses:  Left-sided weakness     Discharge Instructions     Please go to the ER for further evaluation and management.     ED Prescriptions    None     Controlled Substance Prescriptions [place] Controlled Substance Registry consulted? Not Applicable   Dana Porter, Deborah Lazcano A, NP 07/10/18 1800

## 2018-07-10 NOTE — Discharge Instructions (Addendum)
Please go to the ER for further evaluation and management.

## 2018-07-10 NOTE — H&P (Signed)
Subjective: Patient is a 84 y.o. right-handed white female who is admitted for treatment of right frontoparietal chronic subdural hematoma.  Patient was brought today to [facility] room because of repeated falls at home.  She was evaluated with a CT of the brain without contrast that revealed a 3 cm in thickness subdural hematoma with significant compression of the right frontoparietal region, with associated midline shift.  In speaking with her daughter in addition to falling each day, for the past 3 days, she has had at least 3 other falls over the past 6 weeks.  On the other hand the patient does not recall a specific blow to her head or any history of head trauma.  Symptomatically she has had some mild headache on the vertex, but is otherwise without complaints other than for the repeated falls.  Past medical history: Notable for history of hypothyroidism, hypertension, and hypercholesterolemia, all of which were treated.  She denies any history of diabetes, cancer, peptic ulcer disease, heart disease, myocardial infarction, or lung disease.  Past surgical history: Oophorectomy for ovarian cyst, tubal ligation, cholecystectomy, right ankle surgery for fracture, bilateral knee replacements.  Medications include Synthroid 50 mcg every morning, simvastatin 20 mg q. evening, and a blood pressure medication (possibly Accuretic).  Allergies  Allergen Reactions  . Dyazide [Hydrochlorothiazide W-Triamterene] Other (See Comments)    Raised body temp    Social History   Tobacco Use  . Smoking status: Never Smoker  . Smokeless tobacco: Never Used  Substance Use Topics  . Alcohol use: Not on file    Family history: Parents have passed on.  Review of Systems Pertinent items noted in HPI and remainder of comprehensive ROS otherwise negative.  Objective: Vital signs in last 24 hours: Temp:  [97.5 F (36.4 C)-98.6 F (37 C)] 98.6 F (37 C) (12/30 1605) Pulse Rate:   [65-82] 72 (12/30 2030) Resp:  [15-20] 16 (12/30 2030) BP: (139-179)/(66-102) 145/77 (12/30 2030) SpO2:  [96 %-99 %] 98 % (12/30 2030)  EXAM: Patient is a well-developed well-nourished white female in no acute distress. Lungs are clear to auscultation , the patient has symmetrical respiratory excursion. Heart has a regular rate and rhythm normal S1 and S2 no murmur.   Abdomen is soft nontender nondistended bowel sounds are present. Extremity examination shows no clubbing cyanosis or edema. Neurologic examination shows the patient is awake and alert, oriented to name, [facility], and December 2019.  Her speech is fluent.  She follows commands briskly. Pupils are equal, round, reactive to light and about 2-1/2 mm bilaterally.  EOMI.  Mild flattening of left nasolabial fold. Motor examination shows minimal left upper extremity drift, left grip slightly less intense than right grip.  Left iliopsoas 4- to 4/5.  Right iliopsoas 5/5. Sensation intact to pinprick in all 4 extremities. Reflexes symmetrical.  Data Review:CBC    Component Value Date/Time   WBC 12.1 (H) 07/10/2018 1719   RBC 5.05 07/10/2018 1719   HGB 15.6 (H) 07/10/2018 1728   HCT 46.0 07/10/2018 1728   PLT 302 07/10/2018 1719   MCV 89.3 07/10/2018 1719   MCH 27.9 07/10/2018 1719   MCHC 31.3 07/10/2018 1719   RDW 14.0 07/10/2018 1719   LYMPHSABS 3.0 07/10/2018 1719   MONOABS 0.8 07/10/2018 1719   EOSABS 0.1 07/10/2018 1719   BASOSABS 0.0 07/10/2018 1719  BMET    Component Value Date/Time   NA 138 07/10/2018 1728   K 3.4 (L) 07/10/2018 1728   CL 100 07/10/2018 1728   CO2 27 07/10/2018 1719   GLUCOSE 131 (H) 07/10/2018 1728   BUN 13 07/10/2018 1728   CREATININE 0.80 07/10/2018 1728   CALCIUM 9.8 07/10/2018 1719   GFRNONAA >60 07/10/2018 1719   GFRAA >60 07/10/2018 1719     Assessment/Plan: Patient with large right hemispheric (primarily) chronic subdural hematoma with mass-effect and midline  shift with a left hemiparesis.  Morbidities include hypothyroidism, hypertension, and hypercholesterolemia all of which are treated.  I have spoke with the patient, her husband (who is at the bedside), and her children (by phone) including a daughter and several sons.  I have discussed the nature of her condition and have explained that I do not feel that this large subdural hematoma will resolve on its own, and therefore I recommend craniotomy for evacuation of the subdural hematoma.  I discussed the nature the procedure and its risks including risk of infection, bleeding, reaccumulation of the hematoma need for reoperation, neurologic deficit, and anesthetic complications of myocardial infarction, stroke, pneumonia, and death.  After discussing all of this with the patient and her family, and having answered their questions, the patient and her family wish for us to proceed with surgery tonight.   Hewitt ShortsNUDELMAN,ROBERT W, MD 07/10/2018 9:12 PM'

## 2018-07-10 NOTE — ED Notes (Addendum)
Diane RN assisted pt out of car, assessed pt while in parking lot, neg for stoke screening, no drift or facial drooping. A/o x4 c/o fall x2 d/t weakness. D/t pt's sx's pt and family encouraged to go to ED. Pt and her daughter refused to go d/t past trauma experience in the ED.

## 2018-07-11 ENCOUNTER — Encounter (HOSPITAL_COMMUNITY): Payer: Self-pay | Admitting: Neurosurgery

## 2018-07-11 DIAGNOSIS — S065XAA Traumatic subdural hemorrhage with loss of consciousness status unknown, initial encounter: Secondary | ICD-10-CM | POA: Diagnosis present

## 2018-07-11 DIAGNOSIS — S065X9A Traumatic subdural hemorrhage with loss of consciousness of unspecified duration, initial encounter: Secondary | ICD-10-CM | POA: Diagnosis present

## 2018-07-11 LAB — BASIC METABOLIC PANEL
Anion gap: 9 (ref 5–15)
BUN: 10 mg/dL (ref 8–23)
CO2: 23 mmol/L (ref 22–32)
Calcium: 8 mg/dL — ABNORMAL LOW (ref 8.9–10.3)
Chloride: 109 mmol/L (ref 98–111)
Creatinine, Ser: 0.71 mg/dL (ref 0.44–1.00)
GFR calc Af Amer: >60 mL/min (ref >60)
GFR calc non Af Amer: >60 mL/min (ref >60)
Glucose, Bld: 122 mg/dL — ABNORMAL HIGH (ref 70–99)
Potassium: 3.4 mmol/L — ABNORMAL LOW (ref 3.5–5.1)
Sodium: 141 mmol/L (ref 135–145)

## 2018-07-11 LAB — MRSA PCR SCREENING: MRSA by PCR: NEGATIVE

## 2018-07-11 MED ORDER — PROMETHAZINE HCL 25 MG/ML IJ SOLN
6.2500 mg | Freq: Once | INTRAMUSCULAR | Status: AC
  Administered 2018-07-11: 6.25 mg via INTRAVENOUS

## 2018-07-11 MED ORDER — MORPHINE SULFATE (PF) 4 MG/ML IV SOLN
4.0000 mg | INTRAVENOUS | Status: DC | PRN
  Administered 2018-07-11 – 2018-07-13 (×6): 4 mg via INTRAMUSCULAR
  Filled 2018-07-11 (×8): qty 1

## 2018-07-11 MED ORDER — HYDROCODONE-ACETAMINOPHEN 5-325 MG PO TABS
1.0000 | ORAL_TABLET | ORAL | Status: DC | PRN
  Administered 2018-07-11 (×4): 2 via ORAL
  Administered 2018-07-12 – 2018-07-13 (×5): 1 via ORAL
  Administered 2018-07-13 (×3): 2 via ORAL
  Filled 2018-07-11 (×3): qty 2
  Filled 2018-07-11 (×2): qty 1
  Filled 2018-07-11 (×2): qty 2
  Filled 2018-07-11: qty 1
  Filled 2018-07-11 (×2): qty 2
  Filled 2018-07-11: qty 1
  Filled 2018-07-11: qty 2

## 2018-07-11 MED ORDER — PROMETHAZINE HCL 25 MG/ML IJ SOLN
INTRAMUSCULAR | Status: AC
  Filled 2018-07-11: qty 1

## 2018-07-11 MED ORDER — POTASSIUM CHLORIDE IN NACL 40-0.9 MEQ/L-% IV SOLN
INTRAVENOUS | Status: DC
  Administered 2018-07-11 – 2018-07-12 (×4): 100 mL/h via INTRAVENOUS
  Administered 2018-07-14: 50 mL/h via INTRAVENOUS
  Filled 2018-07-11 (×7): qty 1000

## 2018-07-11 MED ORDER — MAGNESIUM HYDROXIDE 400 MG/5ML PO SUSP
30.0000 mL | Freq: Every day | ORAL | Status: DC | PRN
  Administered 2018-07-12: 30 mL via ORAL
  Filled 2018-07-11: qty 30

## 2018-07-11 MED ORDER — LABETALOL HCL 5 MG/ML IV SOLN
5.0000 mg | INTRAVENOUS | Status: DC | PRN
  Administered 2018-07-12: 20 mg via INTRAVENOUS
  Filled 2018-07-11: qty 4

## 2018-07-11 MED ORDER — FENTANYL CITRATE (PF) 100 MCG/2ML IJ SOLN
INTRAMUSCULAR | Status: AC
  Filled 2018-07-11: qty 2

## 2018-07-11 MED ORDER — PANTOPRAZOLE SODIUM 40 MG IV SOLR
40.0000 mg | Freq: Every day | INTRAVENOUS | Status: DC
  Administered 2018-07-11 (×2): 40 mg via INTRAVENOUS
  Filled 2018-07-11 (×2): qty 40

## 2018-07-11 MED ORDER — BISACODYL 10 MG RE SUPP
10.0000 mg | Freq: Every day | RECTAL | Status: DC | PRN
  Administered 2018-07-13: 10 mg via RECTAL
  Filled 2018-07-11: qty 1

## 2018-07-11 MED ORDER — HYDRALAZINE HCL 20 MG/ML IJ SOLN
5.0000 mg | INTRAMUSCULAR | Status: DC | PRN
  Administered 2018-07-11 – 2018-07-14 (×4): 10 mg via INTRAVENOUS
  Filled 2018-07-11 (×5): qty 1

## 2018-07-11 MED ORDER — FENTANYL CITRATE (PF) 100 MCG/2ML IJ SOLN
25.0000 ug | INTRAMUSCULAR | Status: DC | PRN
  Administered 2018-07-11: 25 ug via INTRAVENOUS

## 2018-07-11 MED ORDER — HYDROXYZINE HCL 50 MG/ML IM SOLN
50.0000 mg | INTRAMUSCULAR | Status: DC | PRN
  Administered 2018-07-11 – 2018-07-14 (×7): 50 mg via INTRAMUSCULAR
  Filled 2018-07-11 (×10): qty 1

## 2018-07-11 MED ORDER — FLEET ENEMA 7-19 GM/118ML RE ENEM: 1.0000 | ENEMA | Freq: Once | RECTAL | Status: DC | PRN

## 2018-07-11 MED ORDER — HYDROXYZINE HCL 50 MG PO TABS
50.0000 mg | ORAL_TABLET | ORAL | Status: DC | PRN
  Filled 2018-07-11: qty 1

## 2018-07-11 MED ORDER — PHENOL 1.4 % MT LIQD
1.0000 | OROMUCOSAL | Status: DC | PRN
  Filled 2018-07-11: qty 177

## 2018-07-11 NOTE — Evaluation (Signed)
Physical Therapy Evaluation Patient Details Name: Dana Porter MRN: [id number] DOB: 05/09/1934 Today's Date: 07/11/2018   History of Present Illness  Patient is a 84 y.o female who was brought to ED due to frequent, repeated falls at home.  CT of the brain without contrast that revealed a 3 cm in thickness subdural hematoma with significant compression of the right frontoparietal region, with associated midline shift. She was then admitted fand underwent Right frontoparietal craniotomy and evacuation of subdural hematoma on 12/30. PMH: HTN, hyopthyroidism PSH: oophorectomy for ovarian cyst, cholecystectomy, R anke surfery for fx and bilat TKA.  Clinical Impression  Pt admitted with above. Pt with mild L sided weakness. Pt very lethargic, family states from pain medicine. Pt able to answer questions appropriately and follow commands however difficulty maintaining eyes open. Pt was indep PTA but now is requiring modA for all mobility and hasn't been able to ambulate as of yet. Pt to benefit from CIR upon d/c to maximize functional return.    Follow Up Recommendations CIR    Equipment Recommendations  (TBD at next venue)    Recommendations for Other Services Rehab consult     Precautions / Restrictions Precautions Precautions: Fall Precaution Comments: jp drain from R pariental/s/p crani Restrictions Weight Bearing Restrictions: No      Mobility  Bed Mobility Overal bed mobility: Needs Assistance Bed Mobility: Supine to Sit     Supine to sit: Min assist;Mod assist     General bed mobility comments: pt vomitted s/p eating her icee, HOB elevated while she was vomitting, pt able to bring LEs around to EOB with max directional verbal cues  Transfers Overall transfer level: Needs assistance Equipment used: (1 person assist with gait belt) Transfers: Sit to/from [employer] CorporationStand;Stand Pivot Transfers Sit to Stand: Mod assist Stand pivot transfers: Mod assist       General transfer  comment: pt sleepy and reports mild dizziness upon sitting and standing, pt held onto PT during std pvt transfer to chair, max directional verbal cues for sequencing due to lethargy and fear of falling  Ambulation/Gait             General Gait Details: unable this date  Stairs            Wheelchair Mobility    Modified Rankin (Stroke Patients Only) Modified Rankin (Stroke Patients Only) Pre-Morbid Rankin Score: No significant disability Modified Rankin: Moderately severe disability     Balance Overall balance assessment: Needs assistance Sitting-balance support: Feet supported;Bilateral upper extremity supported Sitting balance-Leahy Scale: Poor Sitting balance - Comments: pt with posterior lean due to fatigue and "Im so sleepy   Standing balance support: During functional activity;Bilateral upper extremity supported Standing balance-Leahy Scale: Fair Standing balance comment: dependent on physical assist                             Pertinent Vitals/Pain Pain Assessment: No/denies pain(c/o nausea)    Home Living Family/patient expects to be discharged to:: Private residence Living Arrangements: Spouse/significant other;Children(daughter and eldery spouse) Available Help at Discharge: Family;Available 24 hours/day(spouse present but cant provide more than supervision) Type of Home: House Home Access: Stairs to enter Entrance Stairs-Rails: Can reach both Entrance Stairs-Number of Steps: 2 Home Layout: One level Home Equipment: Emergency planning/management officerhower seat;Walker - 2 wheels      Prior Function Level of Independence: Independent         Comments: although has had several falls recently  Hand Dominance   Dominant Hand: Right    Extremity/Trunk Assessment   Upper Extremity Assessment Upper Extremity Assessment: LUE deficits/detail LUE Deficits / Details: grossly 4/5, mildly weaker than the R    Lower Extremity Assessment Lower Extremity Assessment:  LLE deficits/detail LLE Deficits / Details: grossly 4/5    Cervical / Trunk Assessment Cervical / Trunk Assessment: Other exceptions(had craniotomy) Cervical / Trunk Exceptions: jp drain  Communication   Communication: No difficulties(but sleepy)  Cognition Arousal/Alertness: Lethargic Behavior During Therapy: WFL for tasks assessed/performed(difficulty keeping eyes open but appropriate) Overall Cognitive Status: Within Functional Limits for tasks assessed                                 General Comments: pt appropriate and following commands however very sleepy and difficulty to maintain eyes opened      General Comments General comments (skin integrity, edema, etc.): pt with jp out of skull, filled with fluid    Exercises     Assessment/Plan    PT Assessment Patient needs continued PT services  PT Problem List Decreased strength;Decreased activity tolerance;Decreased balance;Decreased mobility;Decreased coordination;Decreased knowledge of use of DME       PT Treatment Interventions DME instruction;Gait training;Stair training;Functional mobility training;Therapeutic activities;Therapeutic exercise;Balance training;Neuromuscular re-education    PT Goals (Current goals can be found in the Care Plan section)  Acute Rehab PT Goals Patient Stated Goal: "i dont want to fall anymore" PT Goal Formulation: With patient/family Time For Goal Achievement: 07/25/18 Potential to Achieve Goals: Good    Frequency Min 4X/week   Barriers to discharge        Co-evaluation               AM-PAC PT "6 Clicks" Mobility  Outcome Measure Help needed turning from your back to your side while in a flat bed without using bedrails?: A Little Help needed moving from lying on your back to sitting on the side of a flat bed without using bedrails?: A Little Help needed moving to and from a bed to a chair (including a wheelchair)?: A Little Help needed standing up from a chair  using your arms (e.g., wheelchair or bedside chair)?: A Lot Help needed to walk in hospital room?: A Lot Help needed climbing 3-5 steps with a railing? : A Lot 6 Click Score: 15    End of Session Equipment Utilized During Treatment: Gait belt Activity Tolerance: Patient limited by fatigue;Patient limited by lethargy Patient left: in chair;with call bell/phone within reach;with chair alarm set;with family/visitor present Nurse Communication: Mobility status(+vomitting) PT Visit Diagnosis: Unsteadiness on feet (R26.81);Difficulty in walking, not elsewhere classified (R26.2);Muscle weakness (generalized) (M62.81);History of falling (Z91.81)    Time: [id number] PT Time Calculation (min) (ACUTE ONLY): 38 min   Charges:   PT Evaluation $PT Eval Moderate Complexity: 1 Mod PT Treatments $Therapeutic Activity: 23-37 mins        Lewis ShockAshly Leanda Padmore, PT, DPT Acute Rehabilitation Services Pager #: [phone] Office #: [phone]   Iona Hansenshly M Amaliya Whitelaw 07/11/2018, 3:40 PM

## 2018-07-11 NOTE — ED Notes (Signed)
Pt reassessed while in waiting area. Pt sitting in wheelchair talking with family, no distress noted

## 2018-07-11 NOTE — Progress Notes (Signed)
Rehab Admissions Coordinator Note:  Patient was screened by Clois DupesBoyette, Norene Oliveri Godwin for appropriateness for an Inpatient Acute Rehab Consult per PT recommendation.   At this time, we are recommending Inpatient Rehab consult. Please place order for consult.  Clois DupesBoyette, Casilda Pickerill Godwin RN MSN 07/11/2018, 8:39 PM  I can be reached at [phone].

## 2018-07-11 NOTE — Progress Notes (Signed)
Vitals:   07/11/18 0200 07/11/18 0300 07/11/18 0400 07/11/18 0500  BP: (!) 161/78 (!) 125/51 (!) 135/58 116/62  Pulse: 72 78 88 79  Resp: (!) 23 17 16 17   Temp:   98 F (36.7 C)   TempSrc:   Oral   SpO2: 99% 96% 96% 96%    CBC Recent Labs    07/10/18 1719 07/10/18 1728  WBC 12.1*  --   HGB 14.1 15.6*  HCT 45.1 46.0  PLT 302  --    BMET Recent Labs    07/10/18 1719 07/10/18 1728 07/11/18 0208  NA 138 138 141  K 3.4* 3.4* 3.4*  CL 101 100 109  CO2 27  --  23  GLUCOSE 138* 131* 122*  BUN 11 13 10   CREATININE 0.84 0.80 0.71  CALCIUM 9.8  --  8.0*    Patient resting in ICU, has had some clear liquids.  Complaining of some headache.  [name] putting out moderate amounts of bloody CSF.  Dressing clean and dry.  Left hemiparesis persists (very mild flattening of left nasolabial fold and very mild left drift, somewhat more weakness in left lower extremity).  Plan: We will plan on advancing diet as tolerated, leaving [name] in for at least 2 to 3 days.  Will check CT brain without on Thursday, January 2.  Spoke with patient's son Dana Porter who drove down from [place] overnight, discussing his mother's condition and our plans for treatment and care.  Dana Porter,Dana W, MD 07/11/2018, 6:12 AM

## 2018-07-11 NOTE — ED Notes (Signed)
Pt is shifted over to the left while sitting in the wheelchair. Mild facial droop and arm weakness on the left side on assessment. Dahlia Byesraci Bast NP at bedside.   LSN 07/08/2018.

## 2018-07-11 NOTE — Progress Notes (Signed)
Vitals:   07/11/18 0010 07/11/18 0026 07/11/18 0040 07/11/18 0110  BP: (!) 144/108 (!) 122/44 102/87 121/70  Pulse: 92 (!) 58 81 66  Resp: 16 (!) 21 (!) 24 13  Temp:    (!) (P) 97.3 F (36.3 C)  TempSrc:      SpO2: 94% 100% 97% 98%    CBC Recent Labs    07/10/18 1719 07/10/18 1728  WBC 12.1*  --   HGB 14.1 15.6*  HCT 45.1 46.0  PLT 302  --    BMET Recent Labs    07/10/18 1719 07/10/18 1728  NA 138 138  K 3.4* 3.4*  CL 101 100  CO2 27  --   GLUCOSE 138* 131*  BUN 11 13  CREATININE 0.84 0.80  CALCIUM 9.8  --     Patient seen in PACU.  Initially vomiting, given 6.25 mg of Phenergan IV, and nausea and vomiting settled down.  Awake and alert, following commands.  Patient continues to have mild left hemiparesis.  Dressing clean and dry.  Plan: Spoke with patient's husband and daughter, as well as 1 of her sons by phone, and discussed intraoperative findings, her condition, and our plans for continued treatment and care.  Hewitt ShortsNUDELMAN,ROBERT W, MD 07/11/2018, 1:17 AM

## 2018-07-12 MED ORDER — PANTOPRAZOLE SODIUM 40 MG PO TBEC
40.0000 mg | DELAYED_RELEASE_TABLET | Freq: Every day | ORAL | Status: DC
  Administered 2018-07-12 – 2018-07-13 (×2): 40 mg via ORAL
  Filled 2018-07-12 (×2): qty 1

## 2018-07-12 NOTE — Progress Notes (Signed)
Subjective: The patient is alert and pleasant.  She feels much better.  Is in no apparent distress.  Objective: Vital signs in last 24 hours: Temp:  [97.8 F (36.6 C)-98.7 F (37.1 C)] 98.1 F (36.7 C) (01/01 0400) Pulse Rate:  [52-94] 94 (01/01 0800) Resp:  [10-22] 19 (01/01 0800) BP: (91-161)/(42-136) 156/136 (01/01 0800) SpO2:  [92 %-100 %] 99 % (01/01 0800) There is no height or weight on file to calculate BMI.   Intake/Output from previous day: 12/31 0701 - 01/01 0700 In: 2208.5 [P.O.:120; I.V.:2088.5] Out: 2056 [Urine:1695; Drains:360; Blood:1] Intake/Output this shift: No intake/output data recorded.  Physical exam patient is alert and oriented x2, person and a hospital.  Her speech is unremarkable.  She is moving all of her extremities well.  Her dressing is intact.  Lab Results: Recent Labs    07/10/18 1719 07/10/18 1728  WBC 12.1*  --   HGB 14.1 15.6*  HCT 45.1 46.0  PLT 302  --    BMET Recent Labs    07/10/18 1719 07/10/18 1728 07/11/18 0208  NA 138 138 141  K 3.4* 3.4* 3.4*  CL 101 100 109  CO2 27  --  23  GLUCOSE 138* 131* 122*  BUN 11 13 10   CREATININE 0.84 0.80 0.71  CALCIUM 9.8  --  8.0*    Studies/Results: Ct Head Wo Contrast  Result Date: 07/10/2018 CLINICAL DATA:  85 y/o F; multiple falls over the last 3 days. Left-sided weakness. EXAM: CT HEAD WITHOUT CONTRAST TECHNIQUE: Contiguous axial images were obtained from the base of the skull through the vertex without intravenous contrast. COMPARISON:  None. FINDINGS: Brain: Large extra-axial hemorrhage over the right cerebral convexity, predominantly low in attenuation, but with several high attenuation foci likely representing interval acute hemorrhage and/or retracted clot. The hematoma measures up to 30 mm in thickness. There is associated mass effect on the right cerebral hemisphere with effacement of right lateral ventricle and 11 mm of right-to-left midline shift at the level of septum  pellucidum. The hematoma has a somewhat lentiform appearance, but there is no associated calvarial fracture, and is it is probably subdural. No stroke, focal mass effect of brain parenchyma, intraventricular hemorrhage, hydrocephalus, or additional focus of extra-axial hemorrhage is identified. Vascular: Calcific atherosclerosis of the carotid siphons. No hyperdense vessel identified. Skull: Normal. Negative for fracture or focal lesion. Sinuses/Orbits: No acute finding. Other: None. IMPRESSION: 1. Large mixed attenuation extra-axial hemorrhage over the right cerebral convexity. The hemorrhage measures up to 30 mm in thickness and results in mass effect with 11 mm of right-to-left midline shift. No herniation at this time. 2. No calvarial fracture or brain parenchymal hemorrhage identified. Critical Value/emergent results were called by telephone at the time of interpretation on 07/10/2018 at 6:45 pm to Dr. Adela Lank, who verbally acknowledged these results. Electronically Signed   By: Mitzi Hansen M.D.   On: 07/10/2018 18:46    Assessment/Plan: Status post craniotomy: The patient is doing well clinically  LOS: 2 days     Cristi Loron 07/12/2018, 8:35 AM

## 2018-07-13 ENCOUNTER — Ambulatory Visit: Payer: Self-pay | Admitting: Family Medicine

## 2018-07-13 ENCOUNTER — Inpatient Hospital Stay (HOSPITAL_COMMUNITY): Payer: Medicare Other

## 2018-07-13 ENCOUNTER — Encounter (HOSPITAL_COMMUNITY): Payer: Self-pay | Admitting: Physical Medicine and Rehabilitation

## 2018-07-13 DIAGNOSIS — S065X1S Traumatic subdural hemorrhage with loss of consciousness of 30 minutes or less, sequela: Secondary | ICD-10-CM

## 2018-07-13 DIAGNOSIS — G8194 Hemiplegia, unspecified affecting left nondominant side: Secondary | ICD-10-CM

## 2018-07-13 MED ORDER — MAGNESIUM HYDROXIDE 400 MG/5ML PO SUSP
30.0000 mL | Freq: Once | ORAL | Status: AC
  Administered 2018-07-13: 30 mL via ORAL
  Filled 2018-07-13: qty 30

## 2018-07-13 NOTE — Anesthesia Postprocedure Evaluation (Signed)
Anesthesia Post Note  Patient: Dana Porter  Procedure(s) Performed: CRANIOTOMY HEMATOMA EVACUATION SUBDURAL (Right Head)     Patient location during evaluation: PACU Anesthesia Type: General Level of consciousness: awake and patient cooperative Pain management: pain level controlled Vital Signs Assessment: post-procedure vital signs reviewed and stable Respiratory status: spontaneous breathing, nonlabored ventilation, respiratory function stable and patient connected to nasal cannula oxygen Cardiovascular status: blood pressure returned to baseline and stable Anesthetic complications: no    Last Vitals:  Vitals:   07/13/18 1500 07/13/18 1600  BP: 119/60   Pulse: 68   Resp: 13   Temp:  36.7 C  SpO2: 95%     Last Pain:  Vitals:   07/13/18 1600  TempSrc: Oral  PainSc:                  Dana Porter

## 2018-07-13 NOTE — Progress Notes (Signed)
Inpatient Rehabilitation-Admissions Coordinator    Met with patient and her family at the bedside to discuss team's recommendation for inpatient rehabilitation. Shared booklets, expectations while in CIR, expected length of stay, and anticipated functional level at DC. Pt's family appear to be interested in [place], with pt wavering a bit on her ultimate decision (CIR vs home); [facility] encouraged open discussion tonight. AC will begin insurance benefits check and insurance authorization for possible admit.   Please call if questions.   Jhonnie Garner, OTR/L  Rehab Admissions Coordinator  [phone] 07/13/2018 6:36 PM

## 2018-07-13 NOTE — Progress Notes (Signed)
Subjective: Patient resting comfortably in bed.  Moderate drainage of bloody CSF into [name].  CT shows excellent evacuation of subdural hematoma with significant reduction in mass-effect and shift.  Objective: Vital signs in last 24 hours: Vitals:   07/13/18 0500 07/13/18 0600 07/13/18 0700 07/13/18 0800  BP: 130/64 (!) 159/60 (!) 151/65 (!) 166/81  Pulse: 71 71 70 79  Resp: '13 13  17  ' Temp:    98.3 F (36.8 C)  TempSrc:    Oral  SpO2: 95% 94% 96% 96%    Intake/Output from previous day: 01/01 0701 - 01/02 0700 In: 2667.1 [P.O.:360; I.V.:2307.1] Out: 765 [Urine:475; Drains:290] Intake/Output this shift: Total I/O In: 120 [P.O.:120] Out: 70 [Drains:70]  Physical Exam: Awake and alert, oriented.  Following commands.  Speech fluent.  No longer appreciating flattening of left nasolabial fold.  Little if any drift of the left upper extremity.  Very minimal asymmetry of iliopsoas strength bilaterally.  Dressing removed, drain open to air and removed without difficulty.  Drain exit site stapled closed.  Dressing applied to drain exit site, cranial incision left open to air.  Cranial incision clean and dry; no erythema, ecchymosis, swelling, or drainage.  CBC Recent Labs    07/10/18 1719 07/10/18 1728  WBC 12.1*  --   HGB 14.1 15.6*  HCT 45.1 46.0  PLT 302  --    BMET Recent Labs    07/10/18 1719 07/10/18 1728 07/11/18 0208  NA 138 138 141  K 3.4* 3.4* 3.4*  CL 101 100 109  CO2 27  --  23  GLUCOSE 138* 131* 122*  BUN '11 13 10  ' CREATININE 0.84 0.80 0.71  CALCIUM 9.8  --  8.0*    Studies/Results: Ct Head Wo Contrast  Result Date: 07/13/2018 CLINICAL DATA:  Head trauma follow-up. Status post craniotomy EXAM: CT HEAD WITHOUT CONTRAST TECHNIQUE: Contiguous axial images were obtained from the base of the skull through the vertex without intravenous contrast. COMPARISON:  07/10/2018 FINDINGS: Brain: Status post right frontoparietal craniotomy for hematoma  evacuation with placement of subdural drainage catheter. Greatly decreased size of right convexity extra-axial collection, now measuring 9 mm in thickness, previously 30 mm. There is mild pneumocephalus. Trace leftward midline shift, markedly improved. Vascular: Unremarkable Skull: Status post right frontoparietal craniotomy. Sinuses/Orbits: Paranasal sinuses and mastoid air cells are clear. Normal orbits. Other: None. IMPRESSION: 1. Status post right frontoparietal craniotomy for hematoma evacuation with placement of subdural drainage catheter. 2. Greatly decreased size of right convexity extra-axial collection, now measuring 9 mm in thickness, previously 30 mm. 3. Trace leftward midline shift, markedly improved. Electronically Signed   By: Ulyses Jarred M.D.   On: 07/13/2018 03:16    Assessment/Plan: Doing well following craniotomy for evacuation of chronic/subacute subdural hematoma.  CT this morning much improved.  Left hemiparesis nearly fully resolved.  PT initiated, awaiting OT consultation.  Seen in consultation by PM&R re: CIR. patient has been started on a diet.  Met with patient's family including husband, daughter, son, and daughter-in-law, with another son accessed via phone.  Discussed improvement in her condition, and good appearance of postoperative CT.  Plan continuing PT and OT, and progressing to CIR.   Hosie Spangle, MD 07/13/2018, 10:41 AM

## 2018-07-13 NOTE — Evaluation (Signed)
Occupational Therapy Evaluation Patient Details Name: Dana Porter MRN: [id number] DOB: Mar 21, 1934 Today's Date: 07/13/2018    History of Present Illness Patient is a 85 y.o female who was brought to ED due to frequent, repeated falls at home.  CT of the brain without contrast that revealed a 3 cm in thickness subdural hematoma with significant compression of the right frontoparietal region, with associated midline shift. She was then admitted fand underwent Right frontoparietal craniotomy and evacuation of subdural hematoma on 12/30. PMH: HTN, hyopthyroidism PSH: oophorectomy for ovarian cyst, cholecystectomy, R anke surfery for fx and bilat TKA.   Clinical Impression   Pt admitted with above. She demonstrates the below listed deficits and will benefit from continued OT to maximize safety and independence with BADLs.  Pt presents to OT with mild Lt UE weakness, generalized weakness, very mild Lt inattention, impaired balance, decreased activity tolerance, as well as impaired cognition including deficits with attention, problem solving and awareness.  She currently requires min A for ADLs.  PTA, she was independent with ADLs and IADLs. She lives with her spouse, who uses a [facility] for mobility.  They currently are staying with their daughter who works during the day.   Recommend CIR as she would benefit from the intensity, consistency, and rehab team that specializes in the TBI.   Family is very supportive.  Will follow acutely.       Follow Up Recommendations  CIR;Supervision/Assistance - 24 hour    Equipment Recommendations  3 in 1 bedside commode    Recommendations for Other Services       Precautions / Restrictions Precautions Precautions: Fall Restrictions Weight Bearing Restrictions: No      Mobility Bed Mobility Overal bed mobility: Needs Assistance Bed Mobility: Supine to Sit;Sit to Supine     Supine to sit: Min guard     General bed mobility comments: assist for safety    Transfers Overall transfer level: Needs assistance Equipment used: Rolling walker (2 wheeled) Transfers: Sit to/from [employer] Sit to Stand: Min assist Stand pivot transfers: Min assist       General transfer comment: assist to steady     Balance Overall balance assessment: Needs assistance Sitting-balance support: Feet supported;No upper extremity supported Sitting balance-Leahy Scale: Fair     Standing balance support: No upper extremity supported;During functional activity Standing balance-Leahy Scale: Fair                             ADL either performed or assessed with clinical judgement   ADL Overall ADL's : Needs assistance/impaired Eating/Feeding: Independent   Grooming: Wash/dry hands;Min guard;Standing   Upper Body Bathing: Set up;Supervision/ safety;Sitting   Lower Body Bathing: Minimal assistance;Sit to/from stand   Upper Body Dressing : Set up;Supervision/safety;Sitting   Lower Body Dressing: Minimal assistance;Sit to/from stand   Toilet Transfer: Minimal assistance;Ambulation;RW   Toileting- Clothing Manipulation and Hygiene: Minimal assistance;Sit to/from stand       Functional mobility during ADLs: Minimal assistance;Rolling walker General ADL Comments: assist to steady at times      Vision Baseline Vision/History: No visual deficits Patient Visual Report: No change from baseline Vision Assessment?: Yes Eye Alignment: Within Functional Limits Ocular Range of Motion: Within Functional Limits Alignment/Gaze Preference: Within Defined Limits Tracking/Visual Pursuits: Able to track stimulus in all quads without difficulty Saccades: Undershoots;Decreased speed of saccadic movement Visual Fields: Other (comment) Additional Comments: Pt required frequent cues to complete visual assessment as she  self distracted frequently and demonstrated difficulty shifting attention from one test to the other.  Pt demonstrated  significant slowing of saccades.   When dual stimuli presented in both Lt and Rt central fields, she consistently missed the item on the Lt.  When only single object presented on the Lt, she consistently  was able to identify it      Perception Perception Perception Tested?: Yes Perception Deficits: Inattention/neglect Inattention/Neglect: Does not attend to left visual field Spatial deficits: Pt with questionable mild Lt inattention    Praxis Praxis Praxis tested?: Within functional limits    Pertinent Vitals/Pain Pain Assessment: Faces Faces Pain Scale: Hurts little more Pain Location: head Pain Descriptors / Indicators: Headache;Pressure Pain Intervention(s): Repositioned;Monitored during session     Hand Dominance Right   Extremity/Trunk Assessment Upper Extremity Assessment Upper Extremity Assessment: Generalized weakness           Communication Communication Communication: No difficulties   Cognition Arousal/Alertness: Awake/alert Behavior During Therapy: WFL for tasks assessed/performed(excessively talkative ) Overall Cognitive Status: Impaired/Different from baseline Area of Impairment: Attention;Following commands;Awareness;Problem solving                   Current Attention Level: Selective   Following Commands: Follows one step commands consistently;Follows multi-step commands inconsistently   Awareness: Intellectual Problem Solving: Difficulty sequencing;Requires verbal cues General Comments: Pt is very verbose with speech. She self distracts and does not monitor conversation and interactions well. Family report that this is normal for her, but a bit worse.   Pt had difficulty completing requests due to distracting self with conversation.   She requires min cues at times to execute multi step commands    General Comments  discussed performance with family     Exercises     Shoulder Instructions      Home Living Family/patient expects to be  discharged to:: Private residence Living Arrangements: Spouse/significant other;Children Available Help at Discharge: Family;Available 24 hours/day Type of Home: House Home Access: Stairs to enter [employer] of Steps: 2 Entrance Stairs-Rails: Can reach both Home Layout: One level     Bathroom Shower/Tub: Tub/shower unit;Walk-in shower   Bathroom Toilet: Handicapped height     Home Equipment: Environmental consultant - 2 wheels   Additional Comments: Pt and spouse are wintering in [place] at their daughter's.  They are from [place].  Spouse will be available to assist pt at discharge, and daughter can take FMLA       Prior Functioning/Environment Level of Independence: Independent        Comments: although has had several falls recently        OT Problem List: Decreased strength;Decreased activity tolerance;Impaired balance (sitting and/or standing);Impaired vision/perception;Decreased cognition;Decreased safety awareness      OT Treatment/Interventions: Self-care/ADL training;Neuromuscular education;DME and/or AE instruction;Therapeutic activities;Cognitive remediation/compensation;Visual/perceptual remediation/compensation;Patient/family education;Balance training    OT Goals(Current goals can be found in the care plan section) Acute Rehab OT Goals Patient Stated Goal: to crochet again  OT Goal Formulation: With patient Time For Goal Achievement: 07/27/18 Potential to Achieve Goals: Good ADL Goals Pt Will Perform Grooming: with supervision;standing Pt Will Perform Lower Body Bathing: with supervision;sit to/from stand Pt Will Transfer to Toilet: with supervision;ambulating;regular height toilet;bedside commode;grab bars Pt Will Perform Toileting - Clothing Manipulation and hygiene: with supervision;sit to/from stand Additional ADL Goal #1: Pt will be able to alternate attention during ADLs wtih supervision Additional ADL Goal #2: pt will visually locate all needed ADLs  items in unstructured, cluttered environment with no  more than min cues  OT Frequency: Min 2X/week   Barriers to D/C:            Co-evaluation              AM-PAC OT "6 Clicks" Daily Activity     Outcome Measure Help from another person eating meals?: None Help from another person taking care of personal grooming?: A Little Help from another person toileting, which includes using toliet, bedpan, or urinal?: A Little Help from another person bathing (including washing, rinsing, drying)?: A Little Help from another person to put on and taking off regular upper body clothing?: A Little Help from another person to put on and taking off regular lower body clothing?: A Little 6 Click Score: 19   End of Session Equipment Utilized During Treatment: Gait belt;Rolling walker Nurse Communication: Mobility status  Activity Tolerance: Patient tolerated treatment well Patient left: in chair;with call bell/phone within reach;with chair alarm set  OT Visit Diagnosis: Unsteadiness on feet (R26.81)                Time: [id number] OT Time Calculation (min): 48 min Charges:  OT General Charges $OT Visit: 1 Visit OT Evaluation $OT Eval Moderate Complexity: 1 Mod OT Treatments $Self Care/Home Management : 8-22 mins $Therapeutic Activity: 8-22 mins  Jeani HawkingWendi Callista Hoh, OTR/L Acute Rehabilitation Services Pager [phone] Office [phone]   Jeani HawkingConarpe, Raenell Mensing M 07/13/2018, 3:05 PM

## 2018-07-13 NOTE — Progress Notes (Signed)
Physical Therapy Treatment Patient Details Name: Dana Porter MRN: [id number] DOB: 1933/10/22 Today's Date: 07/13/2018    History of Present Illness Patient is a 85 y.o female who was brought to ED due to frequent, repeated falls at home.  CT of the brain without contrast that revealed a 3 cm in thickness subdural hematoma with significant compression of the right frontoparietal region, with associated midline shift. She was then admitted fand underwent Right frontoparietal craniotomy and evacuation of subdural hematoma on 12/30. PMH: HTN, hyopthyroidism PSH: oophorectomy for ovarian cyst, cholecystectomy, R anke surfery for fx and bilat TKA.    PT Comments    PT helped facilitate pt moving to the [facility] as she felt if she had a BM it would help her nausea.  She was actively vomiting when I arrived.  She did not feel strong enough to walk to the bathroom at this time, but was able to transfer to the [facility] with min guard assist and RW.  She is very internally distractible, talking near constantly and forgetting we were on the [facility] to have a BM.  I would like to attempt hallway gait next session without RW to assess her true balance.  Pt positioned back in bed at end of session to rest.  PT will continue to follow acutely for safe mobility progression.  Follow Up Recommendations  CIR     Equipment Recommendations  None recommended by PT    Recommendations for Other Services Rehab consult     Precautions / Restrictions Precautions Precautions: Fall Restrictions Weight Bearing Restrictions: No    Mobility  Bed Mobility Overal bed mobility: Needs Assistance Bed Mobility: Supine to Sit;Sit to Supine     Supine to sit: Min guard Sit to supine: Min guard   General bed mobility comments: Min guard assist for safety during transitions as pt was very close to the edge of the bed.   Transfers Overall transfer level: Needs assistance Equipment used: Rolling walker (2 wheeled) Transfers: Sit  to/from [employer] Sit to Stand: Min guard Stand pivot transfers: Min guard       General transfer comment: Min guard assist to stand from high bed, min guard assist to turn to [facility] using RW for stability.    Ambulation/Gait             General Gait Details: deferred as pt is nauseated and actively vomiting when PT entered the room, but she did feel like she needed to have a BM and was too weak to walk to the bathroom.           Balance Overall balance assessment: Needs assistance Sitting-balance support: Feet supported;No upper extremity supported Sitting balance-Leahy Scale: Fair Sitting balance - Comments: close supervision EOB.    Standing balance support: Bilateral upper extremity supported;Single extremity supported Standing balance-Leahy Scale: Fair Standing balance comment: requires at least one upper extremity support for balance in standing.                             Cognition Arousal/Alertness: Awake/alert Behavior During Therapy: WFL for tasks assessed/performed Overall Cognitive Status: Impaired/Different from baseline Area of Impairment: Attention;Following commands;Awareness;Problem solving;Rancho level               Rancho Levels of Cognitive Functioning Rancho Los Amigos Scales of Cognitive Functioning: Automatic/appropriate   Current Attention Level: Selective   Following Commands: Follows one step commands consistently;Follows multi-step commands inconsistently   Awareness: Intellectual  Problem Solving: Difficulty sequencing;Requires verbal cues General Comments: She is very interally distractable.           General Comments General comments (skin integrity, edema, etc.): Pt had visitors leaving as PT was entering the room.  I asked her how long they were there and she reported 3 hours.  She reported fatigue and increased HA (and nausea).  I educated her that she may have to limit her visitors at home because  the stimulation may trigger her HA and other symptoms.  I reinforced that TV, noise, phone, and other stimuli may also trigger her HA and not allow her brain to rest and heal as it should.  I also reinforced that going home and being in the bed in the dark all day is also not productive to her brain healing.  We talked at length about "active rest".  This education should likely be reinforced to family next visit.  BP 161/83 seated EOB and 131/60 supine in the bed at end of session.       Pertinent Vitals/Pain Pain Assessment: Faces Faces Pain Scale: Hurts a little bit Pain Location: head Pain Descriptors / Indicators: Headache Pain Intervention(s): Limited activity within patient's tolerance;Monitored during session;Repositioned           PT Goals (current goals can now be found in the care plan section) Acute Rehab PT Goals Patient Stated Goal: to have a BM and eat without getting nauseated Progress towards PT goals: Not progressing toward goals - comment(limited by N/V)    Frequency    Min 4X/week      PT Plan Current plan remains appropriate       AM-PAC PT "6 Clicks" Mobility   Outcome Measure  Help needed turning from your back to your side while in a flat bed without using bedrails?: Total Help needed moving from lying on your back to sitting on the side of a flat bed without using bedrails?: Total Help needed moving to and from a bed to a chair (including a wheelchair)?: Total Help needed standing up from a chair using your arms (e.g., wheelchair or bedside chair)?: Total Help needed to walk in hospital room?: Total Help needed climbing 3-5 steps with a railing? : Total 6 Click Score: 6    End of Session Equipment Utilized During Treatment: Gait belt Activity Tolerance: Patient limited by fatigue;Patient limited by pain;Other (comment)(HA and N/V) Patient left: in bed;with call bell/phone within reach;with bed alarm set   PT Visit Diagnosis: Unsteadiness on feet  (R26.81);Difficulty in walking, not elsewhere classified (R26.2);Muscle weakness (generalized) (M62.81);History of falling (Z91.81)     Time: [id number] PT Time Calculation (min) (ACUTE ONLY): 17 min  Charges:  $Therapeutic Activity: 8-22 mins          Jerold Yoss B. Tsuruko Murtha, PT, DPT  Acute Rehabilitation [phone] pager #[phone] office            07/13/2018, 6:15 PM

## 2018-07-13 NOTE — PMR Pre-admission (Signed)
PMR Admission Coordinator Pre-Admission Assessment  Patient: Dana Porter is an 85 y.o., female MRN: [id number] DOB: [birth year] Height:   Weight:                Insurance Information HMO:     PPO: Yes     PCP:      IPA:      80/20:      OTHER:  PRIMARY: [employer] Medicare      Policy#: [id number]      Subscriber: Patient CM Name: Tresa Endo       Phone#:  [phone]     Fax#: [phone] Pre-Cert#: [id number]      Employer: Patient Auth provided by Tresa Endo with approval for CIR on 07/14/18. Next review date is 07/21/18. Clinical updates are due to Kathlen Brunswick (f): [phone] Benefits:  Phone #: [phone]     Name: Anda Kraft. Date: 07/12/18     Deduct: $198      Out of Pocket Max: $600      Life Max: NA CIR: 80% coverage after deductible (20% coinsurance)      SNF: after deductible, $0/day for days 1-20, $176/day for days 21-100; 100 day limit Outpatient: 80% coverage after deductible (20% coinsurance)     Co-Pay: no visit limit Home Health: 100% after deductible  ; prior auth requierd   Co-Pay:  DME: 80%     Co-Pay: 20%  Providers:  SECONDARY:       Policy#:       Subscriber:  CM Name:       Phone#:      Fax#:  Pre-Cert#:       Employer:  Benefits:  Phone #:      Name:  Eff. Date:      Deduct:       Out of Pocket Max:       Life Max: CIR:       SNF:  Outpatient:      Co-Pay:  Home Health:       Co-Pay:  DME:      Co-Pay:   Medicaid Application Date:       Case Manager:  Disability Application Date:       Case Worker:   Emergency Contact Information Contact Information    Name Relation Home Work [place] Daughter [phone]     ELIYA, BUBAR   [phone]     Current Medical History  Patient Admitting Diagnosis: Right parietal SDH s/p craniotomy/decompression History of Present Illness: Dana Porter is a 85 y.o. female with history of multiple falls who was admitted via ED on 07/10/18 with falls X 3 with left sided weakness. CT head done  revealing large acute on subacute right cerebral hemisphere SDH with effacement of right lateral ventricle and 11 mm right to left midline shift. She was taken to OR emergently for right fronto parietal craniotomy for evacuation of SDH by Dr. Jule Ser.   Follow up CT head shows decrease in size of SDH, marked improvement in midline shift and mild pneumocephalus.  She continues to have left hemiparesis with mild left facial weakness with bouts of lethargy and headaches. Therapy evaluation done and CIR recommended due to functional deficits. Pt is to be admitted to CIR on 07/14/17.  Complete NIHSS TOTAL: 0    Past Medical History  Past Medical History:  Diagnosis Date  . HTN (hypertension)   . Hypothyroid   . OA (osteoarthritis) of knee     Family History  family  history includes Cancer in her son; Heart disease in her father and sister.  Prior Rehab/Hospitalizations:  Has the patient had major surgery during 100 days prior to admission? No  Current Medications   Current Facility-Administered Medications:  .  0.9 % NaCl with KCl 40 mEq / L  infusion, , Intravenous, Continuous, Shirlean KellyNudelman, Robert, MD, Stopped at 07/14/18 [phone] .  acetaminophen (TYLENOL) tablet 650 mg, 650 mg, Oral, Q6H PRN, Shirlean KellyNudelman, Robert, MD, 650 mg at 07/14/18 1050 .  bisacodyl (DULCOLAX) suppository 10 mg, 10 mg, Rectal, Daily PRN, Shirlean KellyNudelman, Robert, MD, 10 mg at 07/13/18 1545 .  cholecalciferol (VITAMIN D3) tablet 2,000 Units, 2,000 Units, Oral, Q lunch, Shirlean KellyNudelman, Robert, MD, 2,000 Units at 07/14/18 1229 .  hydrALAZINE (APRESOLINE) injection 5-10 mg, 5-10 mg, Intravenous, Q1H PRN, Shirlean KellyNudelman, Robert, MD, 10 mg at 07/13/18 0907 .  HYDROcodone-acetaminophen (NORCO/VICODIN) 5-325 MG per tablet 1-2 tablet, 1-2 tablet, Oral, Q4H PRN, Shirlean KellyNudelman, Robert, MD, 2 tablet at 07/13/18 1732 .  hydrOXYzine (ATARAX/VISTARIL) tablet 50 mg, 50 mg, Oral, Q4H PRN, Shirlean KellyNudelman, Robert, MD .  hydrOXYzine (VISTARIL) injection 50 mg, 50 mg,  Intramuscular, Q4H PRN, Shirlean KellyNudelman, Robert, MD, 50 mg at 07/14/18 1232 .  labetalol (NORMODYNE,TRANDATE) injection 5-20 mg, 5-20 mg, Intravenous, Q1H PRN, Shirlean KellyNudelman, Robert, MD, 20 mg at 07/12/18 1238 .  levothyroxine (SYNTHROID, LEVOTHROID) tablet 50 mcg, 50 mcg, Oral, Q0600, Shirlean KellyNudelman, Robert, MD, 50 mcg at 07/14/18 [phone] .  lisinopril (PRINIVIL,ZESTRIL) tablet 10 mg, 10 mg, Oral, Daily, Shirlean KellyNudelman, Robert, MD, 10 mg at 07/14/18 1017 .  magnesium hydroxide (MILK OF MAGNESIA) suspension 30 mL, 30 mL, Oral, Daily PRN, Shirlean KellyNudelman, Robert, MD, 30 mL at 07/12/18 1205 .  morphine 4 MG/ML injection 4-8 mg, 4-8 mg, Intramuscular, Q3H PRN, Shirlean KellyNudelman, Robert, MD, 4 mg at 07/13/18 1940 .  pantoprazole (PROTONIX) EC tablet 40 mg, 40 mg, Oral, Daily, Shirlean KellyNudelman, Robert, MD, 40 mg at 07/13/18 1940 .  phenol (CHLORASEPTIC) mouth spray 1 spray, 1 spray, Mouth/Throat, PRN, Shirlean KellyNudelman, Robert, MD .  simvastatin (ZOCOR) tablet 20 mg, 20 mg, Oral, Daily, Shirlean KellyNudelman, Robert, MD, 20 mg at 07/14/18 1018 .  sodium phosphate (FLEET) 7-19 GM/118ML enema 1 enema, 1 enema, Rectal, Once PRN, Shirlean KellyNudelman, Robert, MD .  traMADol Janean Sark(ULTRAM) tablet 50 mg, 50 mg, Oral, Q6H PRN, Ranelle OysterSwartz, Zachary T, MD  Patients Current Diet:  Diet Order            Diet regular Room service appropriate? 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 None of the above  Prior Functional Level Prior Function Level of Independence:  Independent Comments: although has had several falls recently  Self Care: Did the patient need help bathing, dressing, using the toilet or eating?  Independent  Indoor Mobility: Did the patient need assistance with walking from room to room (with or without device)? Independent  Stairs: Did the patient need assistance with internal or external stairs (with or without device)? Independent  Functional Cognition: Did the patient need help  planning regular tasks such as shopping or remembering to take medications? Independent  Current Functional Level Cognition  Overall Cognitive Status: Impaired/Different from baseline Current Attention Level: Selective Orientation Level: Oriented X4 Following Commands: Follows one step commands consistently, Follows multi-step commands inconsistently General Comments: She is very interally distractable.   [employer] Scales of Cognitive Functioning: Automatic/appropriate    Extremity Assessment (includes Sensation/Coordination)  Upper Extremity Assessment: Generalized weakness LUE Deficits / Details: grossly 4/5, mildly weaker than the R  Lower Extremity Assessment: LLE deficits/detail LLE Deficits / Details: grossly 4/5    ADLs  Overall ADL's : Needs assistance/impaired Eating/Feeding: Independent Grooming: Wash/dry hands, Min guard, Standing Upper Body Bathing: Set up, Supervision/ safety, Sitting Lower Body Bathing: Minimal assistance, Sit to/from stand Upper Body Dressing : Set up, Supervision/safety, Sitting Lower Body Dressing: Minimal assistance, Sit to/from stand Toilet Transfer: Minimal assistance, Ambulation, RW Toileting- Clothing Manipulation and Hygiene: Minimal assistance, Sit to/from stand Functional mobility during ADLs: Minimal assistance, Rolling walker General ADL Comments: assist to steady at times     Mobility  Overal bed mobility: Needs Assistance Bed Mobility: Supine to Sit, Sit to Supine Supine to sit: Min  guard Sit to supine: Min guard General bed mobility comments: Min guard assist for safety during transitions as pt was very close to the edge of the bed.     Transfers  Overall transfer level: Needs assistance Equipment used: Rolling walker (2 wheeled) Transfers: Sit to/from Stand, [employer] Transfers Sit to Stand: Min guard Stand pivot transfers: Min guard General transfer comment: Min guard assist to stand from high bed, min guard assist to turn to [facility] using RW for stability.      Ambulation / Gait / Stairs / Wheelchair Mobility  Ambulation/Gait General Gait Details: deferred as pt is nauseated and actively vomiting when PT entered the room, but she did feel like she needed to have a BM and was too weak to walk to the bathroom.     Posture / Balance Dynamic Sitting Balance Sitting balance - Comments: close supervision EOB.  Balance Overall balance assessment: Needs assistance Sitting-balance support: Feet supported, No upper extremity supported Sitting balance-Leahy Scale: Fair Sitting balance - Comments: close supervision EOB.  Standing balance support: Bilateral upper extremity supported, Single extremity supported Standing balance-Leahy Scale: Fair Standing balance comment: requires at least one upper extremity support for balance in standing.     Special needs/care consideration BiPAP/CPAP: no CPM: no Continuous Drip IV: no Dialysis: no        Days: no Life Vest: no Oxygen: no Special Bed: no Trach Size: no Wound Vac (area): no      Location: no Skin: surgical incision on right side of skull                   Bowel mgmt: continent, last BM: 07/13/18 Bladder mgmt: continent, BSC Diabetic mgmt: no     Previous Home Environment Living Arrangements: Spouse/significant other, Children Available Help at Discharge: Family, Available 24 hours/day Type of Home: House Home Layout: One level Home Access: Stairs to enter Entrance Stairs-Rails: Can reach both Entrance  Stairs-Number of Steps: 2 Bathroom Shower/Tub: Tub/shower unit, Health visitor: Handicapped height Additional Comments: Pt and spouse are wintering in [place] at their daughter's.  They are from [place].  Spouse will be available to assist pt at discharge, and daughter can take FMLA   Discharge Living Setting Plans for Discharge Living Setting: Other (Comment)(home with daugther (pt visiting from  [place])) Type of Home at Discharge: House Discharge Home Layout: One level Discharge Home Access: Stairs to enter Entrance Stairs-Rails: Right Entrance Stairs-Number of Steps: 2 Discharge Bathroom Shower/Tub: Tub/shower unit, Walk-in shower Discharge Bathroom Toilet: Standard Discharge Bathroom Accessibility: Yes How Accessible: Accessible via walker Does the patient have any problems obtaining your medications?: No  Social/Family/Support Systems Patient Roles: Spouse Contact Information: Kern ReapCynthhia (daughter): [phone]; husband Simona Huh(Earl): [phone] Anticipated Caregiver: daughter and husband; family members and church cogregation Anticipated Industrial/product designerCaregiver's Contact Information: see above Ability/Limitations of Caregiver: Min A Caregiver Availability: 24/7 Discharge Plan Discussed with Primary Caregiver: Yes Is Caregiver In Agreement with Plan?: Yes Does Caregiver/Family have Issues with Lodging/Transportation while Pt is in Rehab?: No   Goals/Additional Needs Patient/Family Goal for Rehab: PT/OT/SLP: Mod I Expected length of stay: 7-10 days Cultural Considerations: Christian Dietary Needs: regular diet, thin liqiuds Equipment Needs: TBD Pt/Family Agrees to Admission and willing to participate: Yes Program Orientation Provided & Reviewed with Pt/Caregiver Including Roles  & Responsibilities: Yes(pt, daughter, and husband)  Barriers to Discharge: Home environment access/layout  Barriers to Discharge Comments: steps to enter; from [place], will need to eventually get  back there   Decrease burden of Care through IP rehab admission: NA   Possible need for SNF placement upon discharge:Not anticipated. Pt has good social support at DC and excellent prognosis for further progress through CIR.    Patient Condition: This patient's medical and functional status has changed since the consult dated: 07/13/18 in which the Rehabilitation Physician determined and documented that the patient's condition is appropriate for intensive rehabilitative care in an inpatient rehabilitation facility. Medical changes are: none. See H&P for details. Functional changes are: progression in transfers from Mod A to Min G . After evaluating the patient today and speaking with the Rehabilitation physician and acute team, the patient remains appropriate for inpatient rehab. Will admit to inpatient rehab today.  Preadmission Screen Completed By:  Nanine MeansKelly Fedora Knisely, 07/14/2018 2:52 PM ______________________________________________________________________   Discussed status with Dr. Riley KillSwartz on 07/14/17 at 2:50PM and received telephone approval for admission today.  Admission Coordinator:  Nanine MeansKelly Delmar Dondero, time 2:51PM/Date 07/14/18

## 2018-07-13 NOTE — Consult Note (Signed)
Physical Medicine and Rehabilitation Consult   Reason for Consult: Functional deficits.  Referring Physician:  Dr. Lovell SheehanJenkins.    HPI: Dana Porter is a 85 y.o. female with history of multiple falls who was admitted via ED on 07/10/18 with falls X 3 with left sided weakness. 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 She was taken to OR emergently for right fronto parietal craniotomy for evacuation of SDH by Dr. Jule SerNudleman.   Follow up CT head shows decrease in size of SDH, marked improvement in midline shift and mild pneumocephalus.  She continues to have left hemiparesis with mild left facial weakness with bouts of lethargy and headaches. Therapy evaluation done and CIR recommended due to functional deficits.    Review of Systems  Constitutional: Negative for chills and fever.  HENT: Negative for hearing loss.   Eyes: Negative for blurred vision and double vision.       Has cataracts   Respiratory: Negative for cough and shortness of breath.   Cardiovascular: Negative for chest pain and palpitations.  Gastrointestinal: Positive for nausea. Negative for heartburn.  Genitourinary: Negative for dysuria.  Musculoskeletal: Positive for joint pain (right knee and hip pain due to falls) and myalgias.  Skin: Negative for rash.  Neurological: Positive for focal weakness. Negative for dizziness, speech change and headaches.  Psychiatric/Behavioral: The patient is not nervous/anxious.      Past Medical History:  Diagnosis Date  . HTN (hypertension)   . Hypothyroid   . OA (osteoarthritis) of knee      Past Surgical History:  Procedure Laterality Date  . ANKLE SURGERY Right    due to fracture  . CRANIOTOMY Right 07/10/2018   Procedure: CRANIOTOMY HEMATOMA EVACUATION SUBDURAL;  Surgeon: Shirlean KellyNudelman, Robert, MD;  [facility];  Service: Neurosurgery;  Laterality: Right;   . LAPAROSCOPIC CHOLECYSTECTOMY    . OVARIAN CYST REMOVAL    . TOTAL KNEE ARTHROPLASTY Bilateral     Family History  Problem Relation Age of Onset  . Heart disease Father   . Heart disease Sister   . Cancer Son      Social History:  Married. Independent PTA. From [place] and plans to stay with daughter for the winter. She reports that she has never smoked. She has never used smokeless tobacco. No history on file for alcohol and drug.    Allergies  Allergen Reactions  . Dyazide [Hydrochlorothiazide W-Triamterene] Other (See Comments)    Raised body temp    Medications Prior to Admission  Medication Sig Dispense Refill  . aspirin 81 MG chewable tablet Chew 162 mg by mouth once as needed (for arm numbness).     . Calcium-Magnesium-Vitamin D (CALCIUM 500 PO) Take 500 mg by mouth daily.    . Cholecalciferol (VITAMIN D-3) 25 MCG (1000 UT) CAPS Take 2,000 Units by mouth daily with lunch.    . Garlic 200 MG TABS Take 400 mg by mouth daily with lunch.    . levothyroxine (SYNTHROID, LEVOTHROID) 50 MCG tablet Take 50 mcg by mouth daily before breakfast.    . Magnesium 250 MG TABS Take 250 mg by mouth at bedtime.    . Multiple Vitamins-Minerals (MULTIVITAMIN WITH MINERALS) tablet Take 1 tablet by mouth daily.    . Omega-3 Fatty Acids (FISH OIL) 1000 MG CAPS Take 1,000 mg by mouth daily after lunch.    . quinapril (ACCUPRIL)  10 MG tablet Take 10 mg by mouth at bedtime.    . simvastatin (ZOCOR) 20 MG tablet Take 20 mg by mouth daily.      Home: Home Living Family/patient expects to be discharged to:: Private residence Living Arrangements: Spouse/significant other, Children(daughter and eldery spouse) Available Help at Discharge: Family, Available 24 hours/day(spouse present but cant provide more than supervision) Type of Home: House Home Access: Stairs to enter Enter[employer] CorporationEntrance Stairs-Number of Steps: 2 Entrance Stairs-Rails: Can reach both Home Layout: One level Bathroom Shower/Tub:  Tub/shower unit, Health visitorWalk-in shower Bathroom Toilet: Handicapped height Home Equipment: Information systems managerhower seat, Environmental consultantWalker - 2 wheels  Functional History: Prior Function Level of Independence: Independent Comments: although has had several falls recently Functional Status:  Mobility: Bed Mobility Overal bed mobility: Needs Assistance Bed Mobility: Supine to Sit Supine to sit: Min assist, Mod assist General bed mobility comments: pt vomitted s/p eating her icee, HOB elevated while she was vomitting, pt able to bring LEs around to EOB with max directional verbal cues Transfers Overall transfer level: Needs assistance Equipment used: (1 person assist with gait belt) Transfers: Sit to/from Stand, Stand Pivot Transfers Sit to Stand: Mod assist Stand pivot transfers: Mod assist General transfer comment: pt sleepy and reports mild dizziness upon sitting and standing, pt held onto PT during std pvt transfer to chair, max directional verbal cues for sequencing due to lethargy and fear of falling Ambulation/Gait General Gait Details: unable this date    ADL:    Cognition: Cognition Overall Cognitive Status: Within Functional Limits for tasks assessed Orientation Level: Oriented X4 Cognition Arousal/Alertness: Lethargic Behavior During Therapy: WFL for tasks assessed/performed(difficulty keeping eyes open but appropriate) Overall Cognitive Status: Within Functional Limits for tasks assessed General Comments: pt appropriate and following commands however very sleepy and difficulty to maintain eyes opened   Blood pressure (!) 166/81, pulse 79, temperature 98.3 F (36.8 C), temperature source Oral, resp. rate 17, SpO2 96 %. Physical Exam  Nursing note and vitals reviewed. Constitutional: She is oriented to person, place, and time. She appears well-developed and well-nourished.  HENT:  BANDAGE along right crani incision  Eyes: Pupils are equal, round, and reactive to light.  Neck: Normal range of  motion.  Cardiovascular: Normal rate and regular rhythm.  Musculoskeletal:     Comments: Multiple ecchymotic areas bilateral hands. Bilateral TKA scars, functional ROM in both knees  Neurological: She is alert and oriented to person, place, and time.  Speech clear. Able to follow simple motor commands without difficulty. A bit tangential. Fair insight and awareness. UE 4-/5 prox to distal with decreased FMC on left. LE: 3/5 HF, 3+ to 4/5 KE and 4/5 ADF/PF.  RUE and RLE   4+/5. No focal sensory findings    No results found for this or any previous visit (from the past 24 hour(s)). Ct Head Wo Contrast  Result Date: 07/13/2018 CLINICAL DATA:  Head trauma follow-up. Status post craniotomy EXAM: CT HEAD WITHOUT CONTRAST TECHNIQUE: Contiguous axial images were obtained from the base of the skull through the vertex without intravenous contrast. COMPARISON:  07/10/2018 FINDINGS: Brain: Status post right frontoparietal craniotomy for hematoma evacuation with placement of subdural drainage catheter. Greatly decreased size of right convexity extra-axial collection, now measuring 9 mm in thickness, previously 30 mm. There is mild pneumocephalus. Trace leftward midline shift, markedly improved. Vascular: Unremarkable Skull: Status post right frontoparietal craniotomy. Sinuses/Orbits: Paranasal sinuses and mastoid air cells are clear. Normal orbits. Other: None. IMPRESSION: 1. Status post right frontoparietal craniotomy for hematoma  evacuation with placement of subdural drainage catheter. 2. Greatly decreased size of right convexity extra-axial collection, now measuring 9 mm in thickness, previously 30 mm. 3. Trace leftward midline shift, markedly improved. Electronically Signed   By: Deatra Robinson M.D.   On: 07/13/2018 03:16     Assessment/Plan: Diagnosis: Right parietal SDH s/p craniotomy/decompression 1. Does the need for close, 24 hr/day medical supervision in concert with the patient's rehab needs make it  unreasonable for this patient to be served in a less intensive setting? Yes 2. Co-Morbidities requiring supervision/potential complications: hx of falls, HTN,  3. Due to bladder management, bowel management, safety, skin/wound care, disease management, medication administration, pain management and patient education, does the patient require 24 hr/day rehab nursing? Yes 4. Does the patient require coordinated care of a physician, rehab nurse, PT (1-2 hrs/day, 5 days/week), OT (1-2 hrs/day, 5 days/week) and SLP (1-2 hrs/day, 5 days/week) to address physical and functional deficits in the context of the above medical diagnosis(es)? Yes Addressing deficits in the following areas: balance, endurance, locomotion, strength, transferring, bowel/bladder control, bathing, dressing, feeding, grooming, toileting, cognition and psychosocial support 5. Can the patient actively participate in an intensive therapy program of at least 3 hrs of therapy per day at least 5 days per week? Yes 6. The potential for patient to make measurable gains while on inpatient rehab is excellent 7. Anticipated functional outcomes upon discharge from inpatient rehab are modified independent  with PT, modified independent with OT, modified independent with SLP. 8. Estimated rehab length of stay to reach the above functional goals is: 7-10 days 9. Anticipated D/C setting: Home 10. Anticipated post D/C treatments: HH therapy 11. Overall Rehab/Functional Prognosis: excellent  RECOMMENDATIONS: This patient's condition is appropriate for continued rehabilitative care in the following setting: CIR Patient has agreed to participate in recommended program. Yes Note that insurance prior authorization may be required for reimbursement for recommended care.  Comment: Rehab Admissions Coordinator to follow up.  Thanks,  Ranelle Oyster, MD, [place]  I have personally performed a face to face diagnostic evaluation of this patient.  Additionally, I have reviewed and concur with the physician assistant's documentation above.    Jacquelynn Cree, PA-C 07/13/2018

## 2018-07-13 NOTE — Progress Notes (Signed)
PT Cancellation Note  Patient Details Name: Dana Porter MRN: [id number] DOB: 05-03-1934   Cancelled Treatment:    Reason Eval/Treat Not Completed: Other (comment).  Pt actively vomiting.  RN attempting to get some fast acting meds from MD.  PT will hold this PM and check back tomorrow.  Thanks,  Rollene Rotunda. Eleora Sutherland, PT, DPT  Acute Rehabilitation [phone] pager [phone] office     Rollene Rotunda Mael Delap 07/13/2018, 5:41 PM

## 2018-07-14 ENCOUNTER — Other Ambulatory Visit: Payer: Self-pay

## 2018-07-14 ENCOUNTER — Inpatient Hospital Stay (HOSPITAL_COMMUNITY)
Admission: RE | Admit: 2018-07-14 | Discharge: 2018-07-21 | DRG: 092 | Disposition: A | Discharge status: Home Health | Payer: Medicare Other | Source: Intra-hospital | Attending: Physical Medicine & Rehabilitation | Admitting: Physical Medicine & Rehabilitation

## 2018-07-14 ENCOUNTER — Encounter (HOSPITAL_COMMUNITY): Payer: Self-pay

## 2018-07-14 DIAGNOSIS — S065X9A Traumatic subdural hemorrhage with loss of consciousness of unspecified duration, initial encounter: Secondary | ICD-10-CM | POA: Diagnosis not present

## 2018-07-14 DIAGNOSIS — Z9049 Acquired absence of other specified parts of digestive tract: Secondary | ICD-10-CM

## 2018-07-14 DIAGNOSIS — W19XXXS Unspecified fall, sequela: Secondary | ICD-10-CM | POA: Diagnosis present

## 2018-07-14 DIAGNOSIS — Z8249 Family history of ischemic heart disease and other diseases of the circulatory system: Secondary | ICD-10-CM | POA: Diagnosis not present

## 2018-07-14 DIAGNOSIS — D72829 Elevated white blood cell count, unspecified: Secondary | ICD-10-CM | POA: Diagnosis present

## 2018-07-14 DIAGNOSIS — Z9181 History of falling: Secondary | ICD-10-CM

## 2018-07-14 DIAGNOSIS — Z7982 Long term (current) use of aspirin: Secondary | ICD-10-CM | POA: Diagnosis not present

## 2018-07-14 DIAGNOSIS — E876 Hypokalemia: Secondary | ICD-10-CM | POA: Diagnosis present

## 2018-07-14 DIAGNOSIS — Z888 Allergy status to other drugs, medicaments and biological substances status: Secondary | ICD-10-CM

## 2018-07-14 DIAGNOSIS — S065XAA Traumatic subdural hemorrhage with loss of consciousness status unknown, initial encounter: Secondary | ICD-10-CM

## 2018-07-14 DIAGNOSIS — E039 Hypothyroidism, unspecified: Secondary | ICD-10-CM | POA: Diagnosis present

## 2018-07-14 DIAGNOSIS — E871 Hypo-osmolality and hyponatremia: Secondary | ICD-10-CM | POA: Diagnosis not present

## 2018-07-14 DIAGNOSIS — G8929 Other chronic pain: Secondary | ICD-10-CM

## 2018-07-14 DIAGNOSIS — R51 Headache: Secondary | ICD-10-CM

## 2018-07-14 DIAGNOSIS — I10 Essential (primary) hypertension: Secondary | ICD-10-CM | POA: Diagnosis present

## 2018-07-14 DIAGNOSIS — K219 Gastro-esophageal reflux disease without esophagitis: Secondary | ICD-10-CM | POA: Diagnosis present

## 2018-07-14 DIAGNOSIS — R1013 Epigastric pain: Secondary | ICD-10-CM

## 2018-07-14 DIAGNOSIS — R110 Nausea: Secondary | ICD-10-CM | POA: Diagnosis not present

## 2018-07-14 DIAGNOSIS — R296 Repeated falls: Secondary | ICD-10-CM | POA: Diagnosis present

## 2018-07-14 DIAGNOSIS — G9389 Other specified disorders of brain: Secondary | ICD-10-CM | POA: Diagnosis present

## 2018-07-14 DIAGNOSIS — K5900 Constipation, unspecified: Secondary | ICD-10-CM | POA: Diagnosis present

## 2018-07-14 DIAGNOSIS — G8194 Hemiplegia, unspecified affecting left nondominant side: Secondary | ICD-10-CM | POA: Diagnosis present

## 2018-07-14 DIAGNOSIS — S065X0S Traumatic subdural hemorrhage without loss of consciousness, sequela: Secondary | ICD-10-CM

## 2018-07-14 DIAGNOSIS — Z79890 Hormone replacement therapy (postmenopausal): Secondary | ICD-10-CM

## 2018-07-14 DIAGNOSIS — Z79899 Other long term (current) drug therapy: Secondary | ICD-10-CM

## 2018-07-14 DIAGNOSIS — S065X9S Traumatic subdural hemorrhage with loss of consciousness of unspecified duration, sequela: Secondary | ICD-10-CM | POA: Diagnosis present

## 2018-07-14 DIAGNOSIS — R519 Headache, unspecified: Secondary | ICD-10-CM

## 2018-07-14 DIAGNOSIS — R112 Nausea with vomiting, unspecified: Secondary | ICD-10-CM | POA: Diagnosis present

## 2018-07-14 DIAGNOSIS — Z96653 Presence of artificial knee joint, bilateral: Secondary | ICD-10-CM | POA: Diagnosis present

## 2018-07-14 DIAGNOSIS — D72823 Leukemoid reaction: Secondary | ICD-10-CM

## 2018-07-14 MED ORDER — PANTOPRAZOLE SODIUM 40 MG PO TBEC
40.0000 mg | DELAYED_RELEASE_TABLET | Freq: Every day | ORAL | Status: DC
  Administered 2018-07-14 – 2018-07-15 (×2): 40 mg via ORAL
  Filled 2018-07-14 (×2): qty 1

## 2018-07-14 MED ORDER — TRAMADOL HCL 50 MG PO TABS
50.0000 mg | ORAL_TABLET | Freq: Four times a day (QID) | ORAL | Status: DC | PRN
  Administered 2018-07-14: 50 mg via ORAL
  Filled 2018-07-14: qty 1

## 2018-07-14 MED ORDER — GUAIFENESIN-DM 100-10 MG/5ML PO SYRP: 5.0000 mL | ORAL_SOLUTION | Freq: Four times a day (QID) | ORAL | Status: DC | PRN

## 2018-07-14 MED ORDER — ALUM & MAG HYDROXIDE-SIMETH 200-200-20 MG/5ML PO SUSP: 30.0000 mL | ORAL | Status: DC | PRN

## 2018-07-14 MED ORDER — PROCHLORPERAZINE MALEATE 5 MG PO TABS
5.0000 mg | ORAL_TABLET | Freq: Four times a day (QID) | ORAL | Status: DC | PRN
  Administered 2018-07-14 – 2018-07-15 (×2): 10 mg via ORAL
  Filled 2018-07-14 (×2): qty 2

## 2018-07-14 MED ORDER — LISINOPRIL 10 MG PO TABS
10.0000 mg | ORAL_TABLET | Freq: Every day | ORAL | Status: DC
  Administered 2018-07-15 – 2018-07-19 (×5): 10 mg via ORAL
  Filled 2018-07-14 (×5): qty 1

## 2018-07-14 MED ORDER — SIMVASTATIN 20 MG PO TABS
20.0000 mg | ORAL_TABLET | Freq: Every day | ORAL | Status: DC
  Administered 2018-07-15 – 2018-07-21 (×7): 20 mg via ORAL
  Filled 2018-07-14 (×7): qty 1

## 2018-07-14 MED ORDER — HYDROXYZINE HCL 25 MG PO TABS: 50.0000 mg | ORAL_TABLET | ORAL | Status: DC | PRN

## 2018-07-14 MED ORDER — LEVOTHYROXINE SODIUM 50 MCG PO TABS
50.0000 ug | ORAL_TABLET | Freq: Every day | ORAL | Status: DC
  Administered 2018-07-15 – 2018-07-21 (×7): 50 ug via ORAL
  Filled 2018-07-14 (×7): qty 1

## 2018-07-14 MED ORDER — TRAMADOL HCL 50 MG PO TABS: 50.0000 mg | ORAL_TABLET | Freq: Four times a day (QID) | ORAL | Status: DC | PRN

## 2018-07-14 MED ORDER — VITAMIN D 25 MCG (1000 UNIT) PO TABS
2000.0000 [IU] | ORAL_TABLET | Freq: Every day | ORAL | Status: DC
  Administered 2018-07-15 – 2018-07-20 (×6): 2000 [IU] via ORAL
  Filled 2018-07-14 (×6): qty 2

## 2018-07-14 MED ORDER — POLYETHYLENE GLYCOL 3350 17 G PO PACK
17.0000 g | PACK | Freq: Two times a day (BID) | ORAL | Status: AC
  Administered 2018-07-14 – 2018-07-15 (×2): 17 g via ORAL
  Filled 2018-07-14 (×2): qty 1

## 2018-07-14 MED ORDER — POLYETHYLENE GLYCOL 3350 17 G PO PACK: 17.0000 g | PACK | Freq: Every day | ORAL | Status: DC | PRN

## 2018-07-14 MED ORDER — POLYETHYLENE GLYCOL 3350 17 G PO PACK
17.0000 g | PACK | Freq: Every day | ORAL | Status: DC
  Administered 2018-07-16 – 2018-07-21 (×5): 17 g via ORAL
  Filled 2018-07-14 (×6): qty 1

## 2018-07-14 MED ORDER — HYDROCODONE-ACETAMINOPHEN 5-325 MG PO TABS: 1.0000 | ORAL_TABLET | ORAL | Status: DC | PRN

## 2018-07-14 MED ORDER — PROCHLORPERAZINE 25 MG RE SUPP: 12.5000 mg | Freq: Four times a day (QID) | RECTAL | Status: DC | PRN

## 2018-07-14 MED ORDER — BISACODYL 10 MG RE SUPP
10.0000 mg | Freq: Every day | RECTAL | Status: DC | PRN
  Administered 2018-07-15: 10 mg via RECTAL
  Filled 2018-07-14: qty 1

## 2018-07-14 MED ORDER — ACETAMINOPHEN 325 MG PO TABS
650.0000 mg | ORAL_TABLET | Freq: Four times a day (QID) | ORAL | Status: DC | PRN
  Administered 2018-07-14 (×2): 650 mg via ORAL
  Filled 2018-07-14 (×2): qty 2

## 2018-07-14 MED ORDER — HYDROCODONE-ACETAMINOPHEN 5-325 MG PO TABS: 1.0000 | ORAL_TABLET | Freq: Four times a day (QID) | ORAL | Status: DC | PRN

## 2018-07-14 MED ORDER — PROCHLORPERAZINE EDISYLATE 10 MG/2ML IJ SOLN: 5.0000 mg | Freq: Four times a day (QID) | INTRAMUSCULAR | Status: DC | PRN

## 2018-07-14 MED ORDER — PHENOL 1.4 % MT LIQD: 1.0000 | OROMUCOSAL | Status: DC | PRN

## 2018-07-14 MED ORDER — TRAZODONE HCL 50 MG PO TABS
25.0000 mg | ORAL_TABLET | Freq: Every evening | ORAL | Status: DC | PRN
  Filled 2018-07-14: qty 1

## 2018-07-14 MED ORDER — ADULT MULTIVITAMIN W/MINERALS CH
1.0000 | ORAL_TABLET | Freq: Every day | ORAL | Status: DC
  Administered 2018-07-15 – 2018-07-21 (×7): 1 via ORAL
  Filled 2018-07-14 (×7): qty 1

## 2018-07-14 MED ORDER — ACETAMINOPHEN 325 MG PO TABS
325.0000 mg | ORAL_TABLET | ORAL | Status: DC | PRN
  Administered 2018-07-14 – 2018-07-21 (×14): 650 mg via ORAL
  Filled 2018-07-14 (×18): qty 2

## 2018-07-14 MED ORDER — DIPHENHYDRAMINE HCL 12.5 MG/5ML PO ELIX: 12.5000 mg | ORAL_SOLUTION | Freq: Four times a day (QID) | ORAL | Status: DC | PRN

## 2018-07-14 MED ORDER — FLEET ENEMA 7-19 GM/118ML RE ENEM: 1.0000 | ENEMA | Freq: Once | RECTAL | Status: DC | PRN

## 2018-07-14 MED ORDER — CLONIDINE HCL 0.1 MG PO TABS
0.1000 mg | ORAL_TABLET | ORAL | Status: DC | PRN
  Administered 2018-07-14 – 2018-07-16 (×2): 0.1 mg via ORAL
  Filled 2018-07-14 (×2): qty 1

## 2018-07-14 NOTE — Progress Notes (Signed)
PMR Admission Coordinator Pre-Admission Assessment  Patient: Dana Porter is an 85 y.o., female MRN: [id number] DOB: 1934-02-07 Height:   Weight:                                                                                                                                                    Insurance Information HMO:     PPO: Yes     PCP:      IPA:      80/20:      OTHER:  PRIMARY: [employer] Medicare      Policy#: [id number]      Subscriber: Patient CM Name: Dana Porter       Phone#:  [phone]     Fax#: [phone] Pre-Cert#: [id number]      Employer: Patient Auth provided by Dana Porter with approval for CIR on 07/14/18. Next review date is 07/21/18. Clinical updates are due to Kathlen Brunswick (f): [phone] Benefits:  Phone #: [phone]     Name: Dana Porter. Date: 07/12/18     Deduct: $198      Out of Pocket Max: $600      Life Max: NA CIR: 80% coverage after deductible (20% coinsurance)      SNF: after deductible, $0/day for days 1-20, $176/day for days 21-100; 100 day limit Outpatient: 80% coverage after deductible (20% coinsurance)     Co-Pay: no visit limit Home Health: 100% after deductible  ; prior auth requierd   Co-Pay:  DME: 80%     Co-Pay: 20%  Providers:  SECONDARY:       Policy#:       Subscriber:  CM Name:       Phone#:      Fax#:  Pre-Cert#:       Employer:  Benefits:  Phone #:      Name:  Eff. Date:      Deduct:       Out of Pocket Max:       Life Max: CIR:       SNF:  Outpatient:      Co-Pay:  Home Health:       Co-Pay:  DME:      Co-Pay:   Medicaid Application Date:       Case Manager:  Disability Application Date:       Case Worker:   Emergency Contact Information         Contact Information    Name Relation Home Work [place] Daughter [phone]     Dana Porter   [phone]     Current Medical History  Patient Admitting Diagnosis: Right parietal SDH s/p craniotomy/decompression History of Present Illness: Dana Porter a 85 y.o.femalewith history of multiple falls who was admitted via ED on 07/10/18 with falls X 3withleft sided weakness. CT head done revealing large acute on  subacute right cerebral hemisphere SDH with effacement of right lateral ventricle and 11 mm right to left midline shift. She was taken to OR emergently for right fronto parietal craniotomy for evacuation of SDH by Dr. Jule Porter. Follow up CT head shows decrease in size of SDH, marked improvement in midline shift and mild pneumocephalus. She continues to have left hemiparesis with mild left facial weakness with bouts of lethargy and headaches. Therapy evaluation done and CIR recommended due to functional deficits. Pt is to be admitted to CIR on 07/14/17.  Complete NIHSS TOTAL: 0  Past Medical History      Past Medical History:  Diagnosis Date  . HTN (hypertension)   . Hypothyroid   . OA (osteoarthritis) of knee     Family History  family history includes Cancer in her son; Heart disease in her father and sister.  Prior Rehab/Hospitalizations:  Has the patient had major surgery during 100 days prior to admission? No  Current Medications   Current Facility-Administered Medications:  .  0.9 % NaCl with KCl 40 mEq / L  infusion, , Intravenous, Continuous, Shirlean Jacon Whetzel, MD, Stopped at 07/14/18 [phone] .  acetaminophen (TYLENOL) tablet 650 mg, 650 mg, Oral, Q6H PRN, Shirlean Sylis Ketchum, MD, 650 mg at 07/14/18 1050 .  bisacodyl (DULCOLAX) suppository 10 mg, 10 mg, Rectal, Daily PRN, Shirlean Gordy Goar, MD, 10 mg at 07/13/18 1545 .  cholecalciferol (VITAMIN D3) tablet 2,000 Units, 2,000 Units, Oral, Q lunch, Shirlean Sydna Brodowski, MD, 2,000 Units at 07/14/18 1229 .  hydrALAZINE (APRESOLINE) injection 5-10 mg, 5-10 mg, Intravenous, Q1H PRN, Shirlean Tynan Boesel, MD, 10 mg at 07/13/18 0907 .  HYDROcodone-acetaminophen (NORCO/VICODIN) 5-325 MG per tablet 1-2 tablet, 1-2 tablet, Oral, Q4H PRN, Shirlean Claudis Giovanelli, MD, 2 tablet at 07/13/18  1732 .  hydrOXYzine (ATARAX/VISTARIL) tablet 50 mg, 50 mg, Oral, Q4H PRN, Shirlean Kaytlen Lightsey, MD .  hydrOXYzine (VISTARIL) injection 50 mg, 50 mg, Intramuscular, Q4H PRN, Shirlean Tyquasia Pant, MD, 50 mg at 07/14/18 1232 .  labetalol (NORMODYNE,TRANDATE) injection 5-20 mg, 5-20 mg, Intravenous, Q1H PRN, Shirlean Alyzabeth Pontillo, MD, 20 mg at 07/12/18 1238 .  levothyroxine (SYNTHROID, LEVOTHROID) tablet 50 mcg, 50 mcg, Oral, Q0600, Shirlean Nekeisha Aure, MD, 50 mcg at 07/14/18 [phone] .  lisinopril (PRINIVIL,ZESTRIL) tablet 10 mg, 10 mg, Oral, Daily, Shirlean Elmin Wiederholt, MD, 10 mg at 07/14/18 1017 .  magnesium hydroxide (MILK OF MAGNESIA) suspension 30 mL, 30 mL, Oral, Daily PRN, Shirlean Dovber Ernest, MD, 30 mL at 07/12/18 1205 .  morphine 4 MG/ML injection 4-8 mg, 4-8 mg, Intramuscular, Q3H PRN, Shirlean Alpa Salvo, MD, 4 mg at 07/13/18 1940 .  pantoprazole (PROTONIX) EC tablet 40 mg, 40 mg, Oral, Daily, Shirlean Kaaren Nass, MD, 40 mg at 07/13/18 1940 .  phenol (CHLORASEPTIC) mouth spray 1 spray, 1 spray, Mouth/Throat, PRN, Shirlean Kamryn Gauthier, MD .  simvastatin (ZOCOR) tablet 20 mg, 20 mg, Oral, Daily, Shirlean Bentli Llorente, MD, 20 mg at 07/14/18 1018 .  sodium phosphate (FLEET) 7-19 GM/118ML enema 1 enema, 1 enema, Rectal, Once PRN, Shirlean Josslyn Ciolek, MD .  traMADol Janean Sark) tablet 50 mg, 50 mg, Oral, Q6H PRN, Ranelle Oyster, MD  Patients Current Diet:     Diet Order                  Diet regular Room service appropriate? 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 None of the above  Prior Functional Level Prior Function Level of Independence: Independent Comments: although has had several falls recently  Self Care: Did the patient need help bathing, dressing, using the toilet or eating?  Independent  Indoor Mobility: Did the patient need assistance with walking from room to room (with or without device)? Independent  Stairs: Did the patient need assistance with internal or external stairs (with or without device)? Independent  Functional Cognition: Did the patient need help planning regular tasks such as shopping or remembering to take medications? Independent  Current Functional Level Cognition  Overall Cognitive Status: Impaired/Different from baseline Current Attention Level: Selective Orientation Level: Oriented X4 Following Commands: Follows one step commands consistently, Follows multi-step commands inconsistently General Comments: She is very interally distractable.   [place] Scales of Cognitive Functioning: Automatic/appropriate    Extremity Assessment (includes Sensation/Coordination)  Upper Extremity Assessment: Generalized weakness LUE Deficits / Details: grossly 4/5, mildly weaker than the R  Lower Extremity Assessment: LLE deficits/detail LLE Deficits / Details: grossly 4/5    ADLs  Overall ADL's : Needs assistance/impaired Eating/Feeding: Independent Grooming: Wash/dry hands, Min guard, Standing Upper Body Bathing: Set up, Supervision/ safety, Sitting Lower Body Bathing: Minimal assistance, Sit to/from stand Upper Body Dressing : Set up, Supervision/safety, Sitting Lower Body Dressing: Minimal assistance, Sit to/from stand Toilet Transfer: Minimal assistance, Ambulation, RW Toileting- Clothing Manipulation and Hygiene: Minimal assistance, Sit to/from stand Functional mobility during ADLs: Minimal assistance, Rolling  walker General ADL Comments: assist to steady at times     Mobility  Overal bed mobility: Needs Assistance Bed Mobility: Supine to Sit, Sit to Supine Supine to sit: Min guard Sit to supine: Min guard General bed mobility comments: Min guard assist for safety during transitions as pt was very close to the edge of the bed.     Transfers  Overall transfer level: Needs assistance Equipment used: Rolling walker (2 wheeled) Transfers: Sit to/from Stand, [employer] Transfers Sit to Stand: Min guard Stand pivot transfers: Min guard General transfer comment: Min guard assist to stand from high bed, min guard assist to turn to [facility] using RW for stability.      Ambulation / Gait / Stairs / Wheelchair Mobility  Ambulation/Gait General Gait Details: deferred as pt is nauseated and actively vomiting when PT entered the room, but she did feel like she needed to have a BM and was too weak to walk to the bathroom.     Posture / Balance Dynamic Sitting Balance Sitting balance - Comments: close supervision EOB.  Balance Overall balance assessment: Needs assistance Sitting-balance support: Feet supported, No upper extremity supported Sitting balance-Leahy Scale: Fair Sitting balance - Comments: close supervision EOB.  Standing balance support: Bilateral upper extremity supported, Single extremity supported Standing balance-Leahy Scale: Fair Standing balance comment: requires at least one upper extremity support for balance in standing.     Special needs/care consideration BiPAP/CPAP: no CPM: no Continuous Drip IV: no Dialysis: no        Days: no Life Vest: no Oxygen: no Special Bed: no Trach Size: no Wound Vac (area): no      Location: no Skin:  surgical incision on right side of skull                   Bowel mgmt: continent, last BM: 07/13/18 Bladder mgmt: continent, BSC Diabetic mgmt: no     Previous Home Environment Living Arrangements: Spouse/significant other,  Children Available Help at Discharge: Family, Available 24 hours/day Type of Home: House Home Layout: One level Home Access: Stairs to enter Entrance Stairs-Rails: Can reach both Entrance Stairs-Number of Steps: 2 Bathroom Shower/Tub: Tub/shower unit, Health visitorWalk-in shower Bathroom Toilet: Handicapped height Additional Comments: [place] at [place] daughter's.  They are from [place].  Spouse will be available to assist pt at discharge, and daughter can take FMLA   Discharge Living Setting Plans for Discharge Living Setting: Other (Comment)(home with daugther (pt visiting from [place] Type of Home at Discharge: House Discharge Home Layout: One level Discharge Home Access: Stairs to enter Entrance Stairs-Rails: Right Entrance Stairs-Number of Steps: 2 Discharge Bathroom Shower/Tub: Tub/shower unit, Walk-in shower Discharge Bathroom Toilet: Standard Discharge Bathroom Accessibility: Yes How Accessible: Accessible via walker Does the patient have any problems obtaining your medications?: No  Social/Family/Support Systems Patient Roles: Spouse Contact Information: Kern ReapCynthhia (daughter): [phone]; husband Simona Huh(Earl): [phone] Anticipated Caregiver: daughter and husband; family members and church cogregation Anticipated Industrial/product designerCaregiver's Contact Information: see above Ability/Limitations of Caregiver: Min A Caregiver Availability: 24/7 Discharge Plan Discussed with Primary Caregiver: Yes Is Caregiver In Agreement with Plan?: Yes Does Caregiver/Family have Issues with Lodging/Transportation while Pt is in Rehab?: No   Goals/Additional Needs Patient/Family Goal for Rehab: PT/OT/SLP: Mod I Expected length of stay: 7-10 days Cultural Considerations: Christian Dietary Needs: regular diet, thin liqiuds Equipment Needs: TBD Pt/Family Agrees to Admission and willing to participate: Yes Program Orientation Provided & Reviewed with Pt/Caregiver Including Roles  &  Responsibilities: Yes(pt, daughter, and husband)  Barriers to Discharge: Home environment access/layout  Barriers to Discharge Comments: steps to enter; from [place], will need to eventually get back there   Decrease burden of Care through IP rehab admission: NA   Possible need for SNF placement upon discharge:Not anticipated. Pt has good social support at DC and excellent prognosis for further progress through CIR.    Patient Condition: This patient's medical and functional status has changed since the consult dated: 07/13/18 in which the Rehabilitation Physician determined and documented that the patient's condition is appropriate for intensive rehabilitative care in an inpatient rehabilitation facility. Medical changes are: none. See H&P for details. Functional changes are: progression in transfers from Mod A to Min G . After evaluating the patient today and speaking with the Rehabilitation physician and acute team, the patient remains appropriate for inpatient rehab. Will admit to inpatient rehab today.  Preadmission Screen Completed By:  Nanine MeansKelly Caleigh Rabelo, 07/14/2018 2:52 PM ______________________________________________________________________   Discussed status with Dr. Riley KillSwartz on 07/14/17 at 2:50PM and received telephone approval for admission today.  Admission Coordinator:  Nanine MeansKelly Nissim Fleischer, time 2:51PM/Date 07/14/18           Cosigned by: Ranelle OysterSwartz, Zachary T, MD at 07/14/2018 3:14 PM  Revision History

## 2018-07-14 NOTE — H&P (Signed)
Physical Medicine and Rehabilitation Admission H&P    Chief Complaint  Patient presents with  . Functional deficits due to SDH  . Falls    HPI: Dana Porter is an 85 year old female with history of HTN, daily falls X 3 days with onset of left sided weakness on 07/10/18. 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 She was taken to OR emergently for right fronto-parietal craniotomy for evacuation of SDH by Dr. Jule SerNudleman. Follow up CT head revealed decrease in size of SDH with marked improvement in midline shift and mild pneumocephalus. She continues to have issues with HA, bouts of vomiting, left sided weakness and poor safety awareness with distractibility. CIR recommended due to functional deficits.    Review of Systems  Constitutional: Negative for chills and fever.  HENT: Negative for hearing loss and tinnitus.   Eyes: Negative for blurred vision and double vision.  Respiratory: Negative for cough and shortness of breath.   Cardiovascular: Negative for chest pain and palpitations.  Gastrointestinal: Positive for abdominal pain (for past 6 months), constipation (no bm since admission), nausea (onging for past few months--limits intake) and vomiting.  Genitourinary: Negative for dysuria and frequency.  Musculoskeletal: Positive for myalgias (RLE). Negative for back pain.       Has RLS  Skin: Negative for rash.  Neurological: Positive for focal weakness and headaches (pesistent). Negative for dizziness and speech change.  Psychiatric/Behavioral: Negative for memory loss. The patient is not nervous/anxious and does not have insomnia.      Past Medical History:  Diagnosis Date  . HTN (hypertension)   . Hypothyroid   . OA (osteoarthritis) of knee     Past Surgical History:  Procedure Laterality Date  . ANKLE SURGERY Right    due to fracture  . CRANIOTOMY Right 07/10/2018   Procedure: CRANIOTOMY HEMATOMA EVACUATION SUBDURAL;  Surgeon: Shirlean KellyNudelman, Robert, MD;  [facility];  Service: Neurosurgery;  Laterality: Right;  . LAPAROSCOPIC CHOLECYSTECTOMY    . OVARIAN CYST REMOVAL    . TOTAL KNEE ARTHROPLASTY Bilateral     Family History  Problem Relation Age of Onset  . Heart disease Father   . Heart disease Sister   . Cancer Son     Social History: Married. Independent PTA. From [place] and has been staying with her daughter for the winter. Multiple family members in medical field. She reports that she has never smoked. She has never used smokeless tobacco. No history on file for alcohol and drug.    Allergies  Allergen Reactions  . Dyazide [Hydrochlorothiazide W-Triamterene] Other (See Comments)    Raised body temp   Medications Prior to Admission  Medication Sig Dispense Refill  . aspirin 81 MG chewable tablet Chew 162 mg by mouth once as needed (for arm numbness).     . Calcium-Magnesium-Vitamin D (CALCIUM 500 PO) Take 500 mg by mouth daily.    . Cholecalciferol (VITAMIN D-3) 25 MCG (1000 UT) CAPS Take 2,000 Units by mouth daily with lunch.    . Garlic 200 MG TABS Take 400 mg by mouth daily with lunch.    . levothyroxine (SYNTHROID, LEVOTHROID) 50 MCG tablet Take 50 mcg by mouth daily before breakfast.    . Magnesium 250 MG TABS Take 250 mg by mouth at bedtime.    . Multiple Vitamins-Minerals (MULTIVITAMIN WITH MINERALS) tablet Take 1 tablet by mouth daily.    .Marland Kitchen  Omega-3 Fatty Acids (FISH OIL) 1000 MG CAPS Take 1,000 mg by mouth daily after lunch.    . quinapril (ACCUPRIL) 10 MG tablet Take 10 mg by mouth at bedtime.    . simvastatin (ZOCOR) 20 MG tablet Take 20 mg by mouth daily.      Drug Regimen Review  Drug regimen was reviewed and remains appropriate with no significant issues identified  Home: Home Living Family/patient expects to be discharged to:: Private residence Living Arrangements: Spouse/significant other, Children Available Help at  Discharge: Family, Available 24 hours/day Type of Home: House Home Access: Stairs to enter [employer] of Steps: 2 Entrance Stairs-Rails: Can reach both Home Layout: One level Bathroom Shower/Tub: Tub/shower unit, Health visitor: Handicapped height Home Equipment: Environmental consultant - 2 wheels Additional Comments: Pt and spouse are wintering in [place] at their daughter's.  They are from [place].  Spouse will be available to assist pt at discharge, and daughter can take FMLA    Functional History: Prior Function Level of Independence: Independent Comments: although has had several falls recently  Functional Status:  Mobility: Bed Mobility Overal bed mobility: Needs Assistance Bed Mobility: Supine to Sit, Sit to Supine Supine to sit: Min guard Sit to supine: Min guard General bed mobility comments: Min guard assist for safety during transitions as pt was very close to the edge of the bed.  Transfers Overall transfer level: Needs assistance Equipment used: Rolling walker (2 wheeled) Transfers: Sit to/from Stand, [employer] Transfers Sit to Stand: Min guard Stand pivot transfers: Min guard General transfer comment: Min guard assist to stand from high bed, min guard assist to turn to [facility] using RW for stability.   Ambulation/Gait General Gait Details: deferred as pt is nauseated and actively vomiting when PT entered the room, but she did feel like she needed to have a BM and was too weak to walk to the bathroom.     ADL: ADL Overall ADL's : Needs assistance/impaired Eating/Feeding: Independent Grooming: Wash/dry hands, Min guard, Standing Upper Body Bathing: Set up, Supervision/ safety, Sitting Lower Body Bathing: Minimal assistance, Sit to/from stand Upper Body Dressing : Set up, Supervision/safety, Sitting Lower Body Dressing: Minimal assistance, Sit to/from stand Toilet Transfer: Minimal assistance, Ambulation, RW Toileting- Clothing Manipulation and  Hygiene: Minimal assistance, Sit to/from stand Functional mobility during ADLs: Minimal assistance, Rolling walker General ADL Comments: assist to steady at times   Cognition: Cognition Overall Cognitive Status: Impaired/Different from baseline Orientation Level: Oriented X4 Ranch[place] Mirant Scales of Cognitive Functioning: Automatic/appropriate Cognition Arousal/Alertness: Awake/alert Behavior During Therapy: WFL for tasks assessed/performed Overall Cognitive Status: Impaired/Different from baseline Area of Impairment: Attention, Following commands, Awareness, Problem solving, Rancho level Current Attention Level: Selective Following Commands: Follows one step commands consistently, Follows multi-step commands inconsistently Awareness: Intellectual Problem Solving: Difficulty sequencing, Requires verbal cues General Comments: She is very interally distractable.     Blood pressure (!) 163/78, pulse 80, temperature 97.9 F (36.6 C), temperature source Oral, resp. rate 20, SpO2 97 %. Physical Exam  Constitutional: She is oriented to person, place, and time. She appears well-developed and well-nourished.  HENT:  Right crani incision C/D/I with staples in place.   Eyes: Pupils are equal, round, and reactive to light. EOM are normal.  Neck: Normal range of motion. No tracheal deviation present. No thyromegaly present.  Cardiovascular: Normal rate. Exam reveals no friction rub.  No murmur heard. Respiratory: Effort normal. No respiratory distress. She has no wheezes.  GI: She exhibits no distension. There  is no abdominal tenderness.  Musculoskeletal:        General: No edema.  Neurological: She is alert and oriented to person, place, and time. No cranial nerve deficit. Coordination normal.  Able to answer orientation questions. Speech clear. Verbose. Able to follow basic commands but needs redirection due to tendency to be tangential. Some delays in processing, difficulties with  concentration. UE 4/5 prox to distal. LE: 4-4+/5 bilaterally. No sensory findings.   Skin:  Bilateral TKA scars  Psychiatric:  Pleasant but anxious    No results found for this or any previous visit (from the past 48 hour(s)). Ct Head Wo Contrast  Result Date: 07/13/2018 CLINICAL DATA:  Head trauma follow-up. Status post craniotomy EXAM: CT HEAD WITHOUT CONTRAST TECHNIQUE: Contiguous axial images were obtained from the base of the skull through the vertex without intravenous contrast. COMPARISON:  07/10/2018 FINDINGS: Brain: Status post right frontoparietal craniotomy for hematoma evacuation with placement of subdural drainage catheter. Greatly decreased size of right convexity extra-axial collection, now measuring 9 mm in thickness, previously 30 mm. There is mild pneumocephalus. Trace leftward midline shift, markedly improved. Vascular: Unremarkable Skull: Status post right frontoparietal craniotomy. Sinuses/Orbits: Paranasal sinuses and mastoid air cells are clear. Normal orbits. Other: None. IMPRESSION: 1. Status post right frontoparietal craniotomy for hematoma evacuation with placement of subdural drainage catheter. 2. Greatly decreased size of right convexity extra-axial collection, now measuring 9 mm in thickness, previously 30 mm. 3. Trace leftward midline shift, markedly improved. Electronically Signed   By: Deatra RobinsonKevin  Herman M.D.   On: 07/13/2018 03:16       Medical Problem List and Plan: 1.  Functional deficits  secondary to SDH--repeat CT for 1/6 per NS.   -admit to inpatient rehab 2.  DVT Prophylaxis/Anticoagulation: Mechanical: Sequential compression devices, below knee Bilateral lower extremities 3. Headaches/Pain Management: Tylenol and then tramadol as first line for headache.   -Will use hydrocodone only for sever pain due to concern over nausea.   - Discontinue IV morphine.   -May need Topamax for ongoing HA.  4. Mood: LCSW to follow for evaluation and support.  5. Neuropsych:  This patient is capable of making decisions on her own behalf. 6. Skin/Wound Care:  Monitor scalp for healing. Maintain adequate nutritional and hydration status.  7. Fluids/Electrolytes/Nutrition: Monitor I/O. Check lytes in am. D/c IVF and encourage fluid intake.  8. HTN:  Continue Monitor qid with SBP goals <160. Will add prn clonidine in place of labetalol.  9. Nausea/Vomiting: Continues to be an issue--question acute on chronic. Worse recently with hydrocodone. Had negative swallow study few months ago. Continue PPI.  Question due to constipation. Administer two dose Miralax today. Compazine prn.  10. Hypokalemia: Recheck labs in am.  11. Leukocytosis: Recheck labs in am. Monitor for signs of infection.    Post Admission Physician Evaluation: 1. Functional deficits secondary  to traumatic SDH. 2. Patient is admitted to receive collaborative, interdisciplinary care between the physiatrist, rehab nursing staff, and therapy team. 3. Patient's level of medical complexity and substantial therapy needs in context of that medical necessity cannot be provided at a lesser intensity of care such as a SNF. 4. Patient has experienced substantial functional loss from his/her baseline which was documented above under the "Functional History" and "Functional Status" headings.  Judging by the patient's diagnosis, physical exam, and functional history, the patient has potential for functional progress which will result in measurable gains while on inpatient rehab.  These gains will be of substantial and practical  use upon discharge  in facilitating mobility and self-care at the household level. 5. Physiatrist will provide 24 hour management of medical needs as well as oversight of the therapy plan/treatment and provide guidance as appropriate regarding the interaction of the two. 6. The Preadmission Screening has been reviewed and patient status is unchanged unless otherwise stated above. 7. 24 hour rehab  nursing will assist with bladder management, bowel management, safety, skin/wound care, disease management, medication administration, pain management and patient education  and help integrate therapy concepts, techniques,education, etc. 8. PT will assess and treat for/with: Lower extremity strength, range of motion, stamina, balance, functional mobility, safety, adaptive techniques and equipment, NMR, family education, pain mgt.   Goals are: mod I. 9. OT will assess and treat for/with: ADL's, functional mobility, safety, upper extremity strength, adaptive techniques and equipment, NMR, family ed, pain mgt.   Goals are: mod I. Therapy may proceed with showering this patient. 10. SLP will assess and treat for/with: cognition, family ed.  Goals are: mod I. 11. Case Management and Social Worker will assess and treat for psychological issues and discharge planning. 12. Team conference will be held weekly to assess progress toward goals and to determine barriers to discharge. 13. Patient will receive at least 3 hours of therapy per day at least 5 days per week. 14. ELOS: 7-10 days       15. Prognosis:  excellent   I have personally performed a face to face diagnostic evaluation of this patient and formulated the key components of the plan.  Additionally, I have personally reviewed laboratory data, imaging studies, as well as relevant notes and concur with the physician assistant's documentation above.  Ranelle Oyster, MD, [place] [facility]    Jacquelynn Cree, PA-C 07/14/2018

## 2018-07-14 NOTE — Progress Notes (Signed)
Physical Therapy Treatment Patient Details Name: Dana Porter MRN: [id number] DOB: 01/10/1934 Today's Date: 07/14/2018    History of Present Illness Patient is a 85 y.o female who was brought to ED due to frequent, repeated falls at home.  CT of the brain without contrast that revealed a 3 cm in thickness subdural hematoma with significant compression of the right frontoparietal region, with associated midline shift. She was then admitted fand underwent Right frontoparietal craniotomy and evacuation of subdural hematoma on 12/30. PMH: HTN, hyopthyroidism PSH: oophorectomy for ovarian cyst, cholecystectomy, R anke surfery for fx and bilat TKA.    PT Comments    Pt is progressing well with gait and mobility.  She is too fast with RW (gait speed is too fast to be safe) and between that and her STM deficits (I fear she would not consistently use RW at home) I decided to start practicing gait without an assistive device.  She required heavy min hand held assist and her gait speed was cut in half when AD was removed.  PT will continue to follow acutely for safe mobility progression  Follow Up Recommendations  CIR     Equipment Recommendations  None recommended by PT    Recommendations for Other Services   NA     Precautions / Restrictions Precautions Precautions: Fall    Mobility  Bed Mobility Overal bed mobility: Needs Assistance Bed Mobility: Supine to Sit;Sit to Supine     Supine to sit: Min guard Sit to supine: Min guard   General bed mobility comments: Min guard assist for safety during transitions.   Transfers Overall transfer level: Needs assistance Equipment used: Rolling walker (2 wheeled) Transfers: Sit to/from Stand Sit to Stand: Min guard         General transfer comment: Min guard assist for safety  Ambulation/Gait Ambulation/Gait assistance: Min assist;Min guard Gait Distance (Feet): 130 Feet(130 with RW, 70 without RW) Assistive device: Rolling walker (2  wheeled) Gait Pattern/deviations: Step-through pattern;Staggering left;Staggering right     General Gait Details: Pt was min guard assist with RW with fast gait speed (almost too fast to be safe), heavy min hand held assist without RW and reports of thigh weakness, gait speed was cut in half with removal of assistive device.  Given her STM deficits, I was not sure how compliant she would be at home with RW use.           Balance Overall balance assessment: Needs assistance Sitting-balance support: No upper extremity supported;Feet supported Sitting balance-Leahy Scale: Good     Standing balance support: Bilateral upper extremity supported;No upper extremity supported;Single extremity supported Standing balance-Leahy Scale: Poor Standing balance comment: needs external support in standing.                             Cognition Arousal/Alertness: Awake/alert Behavior During Therapy: WFL for tasks assessed/performed Overall Cognitive Status: Impaired/Different from baseline Area of Impairment: Attention;Memory;Following commands;Safety/judgement;Awareness;Problem solving               Rancho Levels of Cognitive Functioning Rancho Los Amigos Scales of Cognitive Functioning: Automatic/appropriate   Current Attention Level: Selective Memory: Decreased short-term memory Following Commands: Follows one step commands consistently;Follows multi-step commands inconsistently Safety/Judgement: Decreased awareness of safety;Decreased awareness of deficits Awareness: Intellectual Problem Solving: Difficulty sequencing;Requires verbal cues General Comments: Pt continues with verbosity, she repeats stories during our session and seems perseverative on having a BM (RN reports she has been  to bathroom ~ 8 times today and went with me as well.               Pertinent Vitals/Pain Pain Assessment: Faces Faces Pain Scale: Hurts a little bit Pain Location: head Pain  Descriptors / Indicators: Headache Pain Intervention(s): Limited activity within patient's tolerance;Monitored during session;Repositioned(does report minimal increase in HA with mobility. )           PT Goals (current goals can now be found in the care plan section) Acute Rehab PT Goals Patient Stated Goal: to go to the bathroom to try to have a BM (she is a bit perseverative on this task).  Progress towards PT goals: Progressing toward goals    Frequency    Min 4X/week      PT Plan Current plan remains appropriate       AM-PAC PT "6 Clicks" Mobility   Outcome Measure  Help needed turning from your back to your side while in a flat bed without using bedrails?: A Little Help needed moving from lying on your back to sitting on the side of a flat bed without using bedrails?: A Little Help needed moving to and from a bed to a chair (including a wheelchair)?: A Little Help needed standing up from a chair using your arms (e.g., wheelchair or bedside chair)?: A Little Help needed to walk in hospital room?: A Little Help needed climbing 3-5 steps with a railing? : A Little 6 Click Score: 18    End of Session Equipment Utilized During Treatment: Gait belt Activity Tolerance: Patient limited by fatigue Patient left: in bed;with call bell/phone within reach;with bed alarm set Nurse Communication: Mobility status PT Visit Diagnosis: Unsteadiness on feet (R26.81);Difficulty in walking, not elsewhere classified (R26.2);Muscle weakness (generalized) (M62.81);History of falling (Z91.81)     Time: 9675-9163 PT Time Calculation (min) (ACUTE ONLY): 28 min  Charges:  $Gait Training: 8-22 mins $Therapeutic Activity: 8-22 mins             Samik Balkcom B. Rockne Dearinger, PT, DPT  Acute Rehabilitation [phone] pager #[phone] office            07/14/2018, 4:30 PM

## 2018-07-14 NOTE — Plan of Care (Signed)
  Problem: Consults Goal: RH STROKE PATIENT EDUCATION Description See Patient Education module for education specifics  Outcome: Progressing   Problem: RH SKIN INTEGRITY Goal: RH STG SKIN FREE OF INFECTION/BREAKDOWN Outcome: Progressing Goal: RH STG MAINTAIN SKIN INTEGRITY WITH ASSISTANCE Description STG Maintain Skin Integrity With Assistance. Outcome: Progressing   Problem: RH SAFETY Goal: RH STG ADHERE TO SAFETY PRECAUTIONS W/ASSISTANCE/DEVICE Description STG Adhere to Safety Precautions With Assistance/Device. Outcome: Progressing   Problem: RH COGNITION-NURSING Goal: RH STG ANTICIPATES NEEDS/CALLS FOR ASSIST W/ASSIST/CUES Description STG Anticipates Needs/Calls for Assist With Assistance/Cues. Outcome: Progressing   Problem: RH PAIN MANAGEMENT Goal: RH STG PAIN MANAGED AT OR BELOW PT'S PAIN GOAL Outcome: Progressing

## 2018-07-14 NOTE — Progress Notes (Addendum)
Inpatient Rehabilitation-Admissions Coordinator   [facility] has received insurance approval for admit to CIR today. AC was notified by Dr. Earl Gala assistant that he is out and Dr. Venetia Maxon is covering. AC is waiting on determination from Dr. Venetia Maxon for possible admit to CIR today.   Please call if questions.   ADDENDUM 2:06PM: Received DC Order from Dr. Newell Coral.  Pt will DC to CIR today.   Nanine Means, OTR/L  Rehab Admissions Coordinator  [phone] 07/14/2018 1:53 PM

## 2018-07-14 NOTE — Plan of Care (Signed)
Pt approved to go to CIR today.

## 2018-07-14 NOTE — Progress Notes (Signed)
Physical Medicine and Rehabilitation Consult   Reason for Consult: Functional deficits.  Referring Physician:  Dr. Lovell Sheehan.    HPI: Dana Porter is a 85 y.o. female with history of multiple falls who was admitted via ED on 07/10/18 with falls X 3 with left sided weakness. 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 She was taken to OR emergently for right fronto parietal craniotomy for evacuation of SDH by Dr. Jule Ser.   Follow up CT head shows decrease in size of SDH, marked improvement in midline shift and mild pneumocephalus.  She continues to have left hemiparesis with mild left facial weakness with bouts of lethargy and headaches. Therapy evaluation done and CIR recommended due to functional deficits.    Review of Systems  Constitutional: Negative for chills and fever.  HENT: Negative for hearing loss.   Eyes: Negative for blurred vision and double vision.       Has cataracts   Respiratory: Negative for cough and shortness of breath.   Cardiovascular: Negative for chest pain and palpitations.  Gastrointestinal: Positive for nausea. Negative for heartburn.  Genitourinary: Negative for dysuria.  Musculoskeletal: Positive for joint pain (right knee and hip pain due to falls) and myalgias.  Skin: Negative for rash.  Neurological: Positive for focal weakness. Negative for dizziness, speech change and headaches.  Psychiatric/Behavioral: The patient is not nervous/anxious.          Past Medical History:  Diagnosis Date  . HTN (hypertension)   . Hypothyroid   . OA (osteoarthritis) of knee           Past Surgical History:  Procedure Laterality Date  . ANKLE SURGERY Right    due to fracture  . CRANIOTOMY Right 07/10/2018   Procedure: CRANIOTOMY HEMATOMA EVACUATION SUBDURAL;  Surgeon: Shirlean Tiffiany Beadles, MD;  Location: [facility] OR;  Service: Neurosurgery;   Laterality: Right;  . LAPAROSCOPIC CHOLECYSTECTOMY    . OVARIAN CYST REMOVAL    . TOTAL KNEE ARTHROPLASTY Bilateral          Family History  Problem Relation Age of Onset  . Heart disease Father   . Heart disease Sister   . Cancer Son      Social History:  Married. Independent PTA. From [place] and plans to stay with daughter for the winter. She reports that she has never smoked. She has never used smokeless tobacco. No history on file for alcohol and drug.         Allergies  Allergen Reactions  . Dyazide [Hydrochlorothiazide W-Triamterene] Other (See Comments)    Raised body temp          Medications Prior to Admission  Medication Sig Dispense Refill  . aspirin 81 MG chewable tablet Chew 162 mg by mouth once as needed (for arm numbness).     . Calcium-Magnesium-Vitamin D (CALCIUM 500 PO) Take 500 mg by mouth daily.    . Cholecalciferol (VITAMIN D-3) 25 MCG (1000 UT) CAPS Take 2,000 Units by mouth daily with lunch.    . Garlic 200 MG TABS Take 400 mg by mouth daily with lunch.    . levothyroxine (SYNTHROID, LEVOTHROID) 50 MCG tablet Take 50 mcg by mouth daily before breakfast.    . Magnesium 250 MG TABS Take 250 mg by mouth at bedtime.    . Multiple Vitamins-Minerals (MULTIVITAMIN WITH MINERALS) tablet Take 1 tablet by mouth daily.    . Omega-3  Fatty Acids (FISH OIL) 1000 MG CAPS Take 1,000 mg by mouth daily after lunch.    . quinapril (ACCUPRIL) 10 MG tablet Take 10 mg by mouth at bedtime.    . simvastatin (ZOCOR) 20 MG tablet Take 20 mg by mouth daily.      Home: Home Living Family/patient expects to be discharged to:: Private residence Living Arrangements: Spouse/significant other, Children(daughter and eldery spouse) Available Help at Discharge: Family, Available 24 hours/day(spouse present but cant provide more than supervision) Type of Home: House Home Access: Stairs to enter Entergy CorporationEntrance Stairs-Number of Steps: 2 Entrance  Stairs-Rails: Can reach both Home Layout: One level Bathroom Shower/Tub: Tub/shower unit, Health visitorWalk-in shower Bathroom Toilet: Handicapped height Home Equipment: Information systems managerhower seat, Environmental consultantWalker - 2 wheels  Functional History: Prior Function Level of Independence: Independent Comments: although has had several falls recently Functional Status:  Mobility: Bed Mobility Overal bed mobility: Needs Assistance Bed Mobility: Supine to Sit Supine to sit: Min assist, Mod assist General bed mobility comments: pt vomitted s/p eating her icee, HOB elevated while she was vomitting, pt able to bring LEs around to EOB with max directional verbal cues Transfers Overall transfer level: Needs assistance Equipment used: (1 person assist with gait belt) Transfers: Sit to/from Stand, Stand Pivot Transfers Sit to Stand: Mod assist Stand pivot transfers: Mod assist General transfer comment: pt sleepy and reports mild dizziness upon sitting and standing, pt held onto PT during std pvt transfer to chair, max directional verbal cues for sequencing due to lethargy and fear of falling Ambulation/Gait General Gait Details: unable this date  ADL:  Cognition: Cognition Overall Cognitive Status: Within Functional Limits for tasks assessed Orientation Level: Oriented X4 Cognition Arousal/Alertness: Lethargic Behavior During Therapy: WFL for tasks assessed/performed(difficulty keeping eyes open but appropriate) Overall Cognitive Status: Within Functional Limits for tasks assessed General Comments: pt appropriate and following commands however very sleepy and difficulty to maintain eyes opened   Blood pressure (!) 166/81, pulse 79, temperature 98.3 F (36.8 C), temperature source Oral, resp. rate 17, SpO2 96 %. Physical Exam  Nursing note and vitals reviewed. Constitutional: She is oriented to person, place, and time. She appears well-developed and well-nourished.  HENT:  BANDAGE along right crani incision  Eyes:  Pupils are equal, round, and reactive to light.  Neck: Normal range of motion.  Cardiovascular: Normal rate and regular rhythm.  Musculoskeletal:     Comments: Multiple ecchymotic areas bilateral hands. Bilateral TKA scars, functional ROM in both knees  Neurological: She is alert and oriented to person, place, and time.  Speech clear. Able to follow simple motor commands without difficulty. A bit tangential. Fair insight and awareness. UE 4-/5 prox to distal with decreased FMC on left. LE: 3/5 HF, 3+ to 4/5 KE and 4/5 ADF/PF.  RUE and RLE   4+/5. No focal sensory findings    LabResultsLast24Hours  No results found for this or any previous visit (from the past 24 hour(s)).    ImagingResults(Last48hours)  Ct Head Wo Contrast  Result Date: 07/13/2018 CLINICAL DATA:  Head trauma follow-up. Status post craniotomy EXAM: CT HEAD WITHOUT CONTRAST TECHNIQUE: Contiguous axial images were obtained from the base of the skull through the vertex without intravenous contrast. COMPARISON:  07/10/2018 FINDINGS: Brain: Status post right frontoparietal craniotomy for hematoma evacuation with placement of subdural drainage catheter. Greatly decreased size of right convexity extra-axial collection, now measuring 9 mm in thickness, previously 30 mm. There is mild pneumocephalus. Trace leftward midline shift, markedly improved. Vascular: Unremarkable Skull: Status post right  frontoparietal craniotomy. Sinuses/Orbits: Paranasal sinuses and mastoid air cells are clear. Normal orbits. Other: None. IMPRESSION: 1. Status post right frontoparietal craniotomy for hematoma evacuation with placement of subdural drainage catheter. 2. Greatly decreased size of right convexity extra-axial collection, now measuring 9 mm in thickness, previously 30 mm. 3. Trace leftward midline shift, markedly improved. Electronically Signed   By: Deatra RobinsonKevin  Herman M.D.   On: 07/13/2018 03:16      Assessment/Plan: Diagnosis: Right parietal  SDH s/p craniotomy/decompression 1. Does the need for close, 24 hr/day medical supervision in concert with the patient's rehab needs make it unreasonable for this patient to be served in a less intensive setting? Yes 2. Co-Morbidities requiring supervision/potential complications: hx of falls, HTN,  3. Due to bladder management, bowel management, safety, skin/wound care, disease management, medication administration, pain management and patient education, does the patient require 24 hr/day rehab nursing? Yes 4. Does the patient require coordinated care of a physician, rehab nurse, PT (1-2 hrs/day, 5 days/week), OT (1-2 hrs/day, 5 days/week) and SLP (1-2 hrs/day, 5 days/week) to address physical and functional deficits in the context of the above medical diagnosis(es)? Yes Addressing deficits in the following areas: balance, endurance, locomotion, strength, transferring, bowel/bladder control, bathing, dressing, feeding, grooming, toileting, cognition and psychosocial support 5. Can the patient actively participate in an intensive therapy program of at least 3 hrs of therapy per day at least 5 days per week? Yes 6. The potential for patient to make measurable gains while on inpatient rehab is excellent 7. Anticipated functional outcomes upon discharge from inpatient rehab are modified independent  with PT, modified independent with OT, modified independent with SLP. 8. Estimated rehab length of stay to reach the above functional goals is: 7-10 days 9. Anticipated D/C setting: Home 10. Anticipated post D/C treatments: HH therapy 11. Overall Rehab/Functional Prognosis: excellent  RECOMMENDATIONS: This patient's condition is appropriate for continued rehabilitative care in the following setting: CIR Patient has agreed to participate in recommended program. Yes Note that insurance prior authorization may be required for reimbursement for recommended care.  Comment: Rehab Admissions Coordinator to  follow up.  Thanks,  Ranelle OysterZachary T. Swartz, MD, [place] DomFAAPMR  I have personally performed a face to face diagnostic evaluation of this patient. Additionally, I have reviewed and concur with the physician assistant's documentation above.    Jacquelynn Creeamela S Love, PA-C 07/13/2018        Revision History              Routing History

## 2018-07-14 NOTE — Progress Notes (Signed)
Patient received via wheelchair to 4W10 accompanied by Spouse and Daughter. Patient alert and oriented x 4. Skin assessment rendered x 2 RN's. Safety and Fall Prevention Precautions reviewed with Patient, Daughter and Spouse with understanding verbalized.

## 2018-07-14 NOTE — H&P (Signed)
Physical Medicine and Rehabilitation Admission H&P        Chief Complaint  Patient presents with  . Functional deficits due to SDH  . Falls      HPI: Dana Porter is an 85 year old female with history of HTN, daily falls X 3 days with onset of left sided weakness on 07/10/18. 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 She was taken to OR emergently for right fronto-parietal craniotomy for evacuation of SDH by Dr. Jule Ser. Follow up CT head revealed decrease in size of SDH with marked improvement in midline shift and mild pneumocephalus. She continues to have issues with HA, bouts of vomiting, left sided weakness and poor safety awareness with distractibility. CIR recommended due to functional deficits.      Review of Systems  Constitutional: Negative for chills and fever.  HENT: Negative for hearing loss and tinnitus.   Eyes: Negative for blurred vision and double vision.  Respiratory: Negative for cough and shortness of breath.   Cardiovascular: Negative for chest pain and palpitations.  Gastrointestinal: Positive for abdominal pain (for past 6 months), constipation (no bm since admission), nausea (onging for past few months--limits intake) and vomiting.  Genitourinary: Negative for dysuria and frequency.  Musculoskeletal: Positive for myalgias (RLE). Negative for back pain.       Has RLS  Skin: Negative for rash.  Neurological: Positive for focal weakness and headaches (pesistent). Negative for dizziness and speech change.  Psychiatric/Behavioral: Negative for memory loss. The patient is not nervous/anxious and does not have insomnia.           Past Medical History:  Diagnosis Date  . HTN (hypertension)    . Hypothyroid    . OA (osteoarthritis) of knee             Past Surgical History:  Procedure Laterality Date  . ANKLE SURGERY Right      due to fracture  .  CRANIOTOMY Right 07/10/2018    Procedure: CRANIOTOMY HEMATOMA EVACUATION SUBDURAL;  Surgeon: Shirlean Kelly, MD;  Location: [facility] OR;  Service: Neurosurgery;  Laterality: Right;  . LAPAROSCOPIC CHOLECYSTECTOMY      . OVARIAN CYST REMOVAL      . TOTAL KNEE ARTHROPLASTY Bilateral             Family History  Problem Relation Age of Onset  . Heart disease Father    . Heart disease Sister    . Cancer Son        Social History: Married. Independent PTA. From [place] and has been staying with her daughter for the winter. Multiple family members in medical field. She reports that she has never smoked. She has never used smokeless tobacco. No history on file for alcohol and drug.           Allergies  Allergen Reactions  . Dyazide [Hydrochlorothiazide W-Triamterene] Other (See Comments)      Raised body temp          Medications Porter to Admission  Medication Sig Dispense Refill  . aspirin 81 MG chewable tablet Chew 162 mg by mouth once as needed (for arm numbness).       . Calcium-Magnesium-Vitamin D (CALCIUM 500 PO) Take 500 mg by mouth daily.      . Cholecalciferol (VITAMIN D-3) 25 MCG (1000 UT) CAPS Take 2,000 Units by mouth daily with lunch.      Marland Kitchen  Garlic 200 MG TABS Take 400 mg by mouth daily with lunch.      . levothyroxine (SYNTHROID, LEVOTHROID) 50 MCG tablet Take 50 mcg by mouth daily before breakfast.      . Magnesium 250 MG TABS Take 250 mg by mouth at bedtime.      . Multiple Vitamins-Minerals (MULTIVITAMIN WITH MINERALS) tablet Take 1 tablet by mouth daily.      . Omega-3 Fatty Acids (FISH OIL) 1000 MG CAPS Take 1,000 mg by mouth daily after lunch.      . quinapril (ACCUPRIL) 10 MG tablet Take 10 mg by mouth at bedtime.      . simvastatin (ZOCOR) 20 MG tablet Take 20 mg by mouth daily.          Drug Regimen Review  Drug regimen was reviewed and remains appropriate with no significant issues identified   Home: Home Living Family/patient expects to be discharged to::  Private residence Living Arrangements: Spouse/significant other, Children Available Help at Discharge: Family, Available 24 hours/day Type of Home: House Home Access: Stairs to enter Entergy CorporationEntrance Stairs-Number of Steps: 2 Entrance Stairs-Rails: Can reach both Home Layout: One level Bathroom Shower/Tub: Tub/shower unit, Health visitorWalk-in shower Bathroom Toilet: Handicapped height Home Equipment: Environmental consultantWalker - 2 wheels Additional Comments: Pt and [place] winter[place] in [place] at their daughter's.  They are from [place].  Spouse will be available to assist pt at discharge, and daughter can take FMLA    Functional History: Porter Function Level of Independence: Independent Comments: although has had several falls recently   Functional Status:  Mobility: Bed Mobility Overal bed mobility: Needs Assistance Bed Mobility: Supine to Sit, Sit to Supine Supine to sit: Min guard Sit to supine: Min guard General bed mobility comments: Min guard assist for safety during transitions as pt was very close to the edge of the bed.  Transfers Overall transfer level: Needs assistance Equipment used: Rolling walker (2 wheeled) Transfers: Sit to/from Stand, [employer]adark[employer] Petroleum CorporationStand Pivot Transfers Sit to Stand: Min guard Stand pivot transfers: Min guard General transfer comment: Min guard assist to stand from high bed, min guard assist to turn to [facility] using RW for stability.   Ambulation/Gait General Gait Details: deferred as pt is nauseated and actively vomiting when PT entered the room, but she did feel like she needed to have a BM and was too weak to walk to the bathroom.    ADL: ADL Overall ADL's : Needs assistance/impaired Eating/Feeding: Independent Grooming: Wash/dry hands, Min guard, Standing Upper Body Bathing: Set up, Supervision/ safety, Sitting Lower Body Bathing: Minimal assistance, Sit to/from stand Upper Body Dressing : Set up, Supervision/safety, Sitting Lower Body Dressing: Minimal assistance, Sit to/from  stand Toilet Transfer: Minimal assistance, Ambulation, RW Toileting- Clothing Manipulation and Hygiene: Minimal assistance, Sit to/from stand Functional mobility during ADLs: Minimal assistance, Rolling walker General ADL Comments: assist to steady at times    Cognition: Cognition Overall Cognitive Status: Impaired/Different from baseline Orientation Level: Oriented X4 [place] MirantLos Amigos Scales of Cognitive Functioning: Automatic/appropriate Cognition Arousal/Alertness: Awake/alert Behavior During Therapy: WFL for tasks assessed/performed Overall Cognitive Status: Impaired/Different from baseline Area of Impairment: Attention, Following commands, Awareness, Problem solving, Rancho level Current Attention Level: Selective Following Commands: Follows one step commands consistently, Follows multi-step commands inconsistently Awareness: Intellectual Problem Solving: Difficulty sequencing, Requires verbal cues General Comments: She is very interally distractable.       Blood pressure (!) 163/78, pulse 80, temperature 97.9 F (36.6 C), temperature source Oral, resp. rate 20, SpO2 97 %.  Physical Exam  Constitutional: She is oriented to person, place, and time. She appears well-developed and well-nourished.  HENT:  Right crani incision C/D/I with staples in place.   Eyes: Pupils are equal, round, and reactive to light. EOM are normal.  Neck: Normal range of motion. No tracheal deviation present. No thyromegaly present.  Cardiovascular: Normal rate. Exam reveals no friction rub.  No murmur heard. Respiratory: Effort normal. No respiratory distress. She has no wheezes.  GI: She exhibits no distension. There is no abdominal tenderness.  Musculoskeletal:        General: No edema.  Neurological: She is alert and oriented to person, place, and time. No cranial nerve deficit. Coordination normal.  Able to answer orientation questions. Speech clear. Verbose. Able to follow basic commands but  needs redirection due to tendency to be tangential. Some delays in processing, difficulties with concentration. UE 4/5 prox to distal. LE: 4-4+/5 bilaterally. No sensory findings.   Skin:  Bilateral TKA scars  Psychiatric:  Pleasant but anxious      Lab Results Last 48 Hours  No results found for this or any previous visit (from the past 48 hour(s)).    Imaging Results (Last 48 hours)  Ct Head Wo Contrast   Result Date: 07/13/2018 CLINICAL DATA:  Head trauma follow-up. Status post craniotomy EXAM: CT HEAD WITHOUT CONTRAST TECHNIQUE: Contiguous axial images were obtained from the base of the skull through the vertex without intravenous contrast. COMPARISON:  07/10/2018 FINDINGS: Brain: Status post right frontoparietal craniotomy for hematoma evacuation with placement of subdural drainage catheter. Greatly decreased size of right convexity extra-axial collection, now measuring 9 mm in thickness, previously 30 mm. There is mild pneumocephalus. Trace leftward midline shift, markedly improved. Vascular: Unremarkable Skull: Status post right frontoparietal craniotomy. Sinuses/Orbits: Paranasal sinuses and mastoid air cells are clear. Normal orbits. Other: None. IMPRESSION: 1. Status post right frontoparietal craniotomy for hematoma evacuation with placement of subdural drainage catheter. 2. Greatly decreased size of right convexity extra-axial collection, now measuring 9 mm in thickness, previously 30 mm. 3. Trace leftward midline shift, markedly improved. Electronically Signed   By: Deatra Robinson M.D.   On: 07/13/2018 03:16             Medical Problem List and Plan: 1.  Functional deficits  secondary to SDH--repeat CT for 1/6 per NS.              -admit to inpatient rehab 2.  DVT Prophylaxis/Anticoagulation: Mechanical: Sequential compression devices, below knee Bilateral lower extremities 3. Headaches/Pain Management: Tylenol and then tramadol as first line for headache.              -Will use  hydrocodone only for sever pain due to concern over nausea.              - Discontinue IV morphine.              -May need Topamax for ongoing HA.  4. Mood: LCSW to follow for evaluation and support.  5. Neuropsych: This patient is capable of making decisions on her own behalf. 6. Skin/Wound Care:  Monitor scalp for healing. Maintain adequate nutritional and hydration status.  7. Fluids/Electrolytes/Nutrition: Monitor I/O. Check lytes in am. D/c IVF and encourage fluid intake.  8. HTN:  Continue Monitor qid with SBP goals <160. Will add prn clonidine in place of labetalol.  9. Nausea/Vomiting: Continues to be an issue--question acute on chronic. Worse recently with hydrocodone. Had negative swallow study few months ago. Continue  PPI.  Question due to constipation. Administer two dose Miralax today. Compazine prn.  10. Hypokalemia: Recheck labs in am.  11. Leukocytosis: Recheck labs in am. Monitor for signs of infection.      Post Admission Physician Evaluation: 1. Functional deficits secondary  to traumatic SDH. 2. Patient is admitted to receive collaborative, interdisciplinary care between the physiatrist, rehab nursing staff, and therapy team. 3. Patient's level of medical complexity and substantial therapy needs in context of that medical necessity cannot be provided at a lesser intensity of care such as a SNF. 4. Patient has experienced substantial functional loss from his/her baseline which was documented above under the "Functional History" and "Functional Status" headings.  Judging by the patient's diagnosis, physical exam, and functional history, the patient has potential for functional progress which will result in measurable gains while on inpatient rehab.  These gains will be of substantial and practical use upon discharge  in facilitating mobility and self-care at the household level. 5. Physiatrist will provide 24 hour management of medical needs as well as oversight of the therapy  plan/treatment and provide guidance as appropriate regarding the interaction of the two. 6. The Preadmission Screening has been reviewed and patient status is unchanged unless otherwise stated above. 7. 24 hour rehab nursing will assist with bladder management, bowel management, safety, skin/wound care, disease management, medication administration, pain management and patient education  and help integrate therapy concepts, techniques,education, etc. 8. PT will assess and treat for/with: Lower extremity strength, range of motion, stamina, balance, functional mobility, safety, adaptive techniques and equipment, NMR, family education, pain mgt.   Goals are: mod I. 9. OT will assess and treat for/with: ADL's, functional mobility, safety, upper extremity strength, adaptive techniques and equipment, NMR, family ed, pain mgt.   Goals are: mod I. Therapy may proceed with showering this patient. 10. SLP will assess and treat for/with: cognition, family ed.  Goals are: mod I. 11. Case Management and Social Worker will assess and treat for psychological issues and discharge planning. 12. Team conference will be held weekly to assess progress toward goals and to determine barriers to discharge. 13. Patient will receive at least 3 hours of therapy per day at least 5 days per week. 14. ELOS: 7-10 days       15. Prognosis:  excellent     I have personally performed a face to face diagnostic evaluation of this patient and formulated the key components of the plan.  Additionally, I have personally reviewed laboratory data, imaging studies, as well as relevant notes and concur with the physician assistant's documentation above.   Ranelle Oyster, MD, [place] Dom     Jacquelynn Cree, PA-C 07/14/2018   The patient's status has not changed. The original post admission physician evaluation remains appropriate, and any changes from the pre-admission screening or documentation from the acute chart are noted above.  Ranelle Oyster, MD 07/14/2018

## 2018-07-14 NOTE — Progress Notes (Signed)
Subjective: Patient having varying amount of nausea, being given Vistaril IM as needed.  Admitted activity so far, primarily up and down to the bathroom.  Objective: Vital signs in last 24 hours: Vitals:   07/14/18 0355 07/14/18 0405 07/14/18 0500 07/14/18 0600  BP:  (!) 152/79 (!) 132/56 (!) 133/59  Pulse:  72 62 64  Resp:  14 11 12   Temp: 98 F (36.7 C)     TempSrc: Oral     SpO2:  93% 93% 93%    Intake/Output from previous day: 01/02 0701 - 01/03 0700 In: 850.3 [P.O.:360; I.V.:490.3] Out: 160 [Drains:160] Intake/Output this shift: No intake/output data recorded.  Physical Exam: Awake and alert, oriented.  Following commands.  Speech fluent.  Moving all 4 extremities well.  Dressing from drain exit site removed; cranial incision and drain exit site incision both healing nicely (no erythema, swelling, ecchymosis, or drainage).  Assessment/Plan: Doing well overall.  Spoke with the patient and her nurse Loletta Specter) regarding mobilization including out of bed to chair and ambulation in the halls.  Continuing PT and OT.  Awaiting CIR (patient certainly will benefit from CIR, I expect that she will do much better than if she were to attempt to return home at this time, and I do not feel that she has the judgment to elect to defer CIR. Overall I feel that she is stable enough to transfer to CIR.  I have requested follow-up CT the brain without contrast for the morning of January 6 (Monday).   Hewitt Shorts, MD 07/14/2018, 8:25 AM

## 2018-07-15 ENCOUNTER — Inpatient Hospital Stay (HOSPITAL_COMMUNITY): Payer: Medicare Other | Admitting: Speech Pathology

## 2018-07-15 ENCOUNTER — Inpatient Hospital Stay (HOSPITAL_COMMUNITY): Payer: Medicare Other

## 2018-07-15 ENCOUNTER — Inpatient Hospital Stay (HOSPITAL_COMMUNITY): Payer: Medicare Other | Admitting: Physical Therapy

## 2018-07-15 DIAGNOSIS — R110 Nausea: Secondary | ICD-10-CM

## 2018-07-15 DIAGNOSIS — S065X9A Traumatic subdural hemorrhage with loss of consciousness of unspecified duration, initial encounter: Secondary | ICD-10-CM

## 2018-07-15 LAB — CBC WITH DIFFERENTIAL/PLATELET
Abs Immature Granulocytes: 0.02 10*3/uL (ref 0.00–0.07)
Basophils Absolute: 0 10*3/uL (ref 0.0–0.1)
Basophils Relative: 0 %
EOS ABS: 0.1 10*3/uL (ref 0.0–0.5)
Eosinophils Relative: 1 %
HCT: 34.2 % — ABNORMAL LOW (ref 36.0–46.0)
Hemoglobin: 11.1 g/dL — ABNORMAL LOW (ref 12.0–15.0)
Immature Granulocytes: 0 %
LYMPHS ABS: 1.6 10*3/uL (ref 0.7–4.0)
Lymphocytes Relative: 17 %
MCH: 28.8 pg (ref 26.0–34.0)
MCHC: 32.5 g/dL (ref 30.0–36.0)
MCV: 88.6 fL (ref 80.0–100.0)
Monocytes Absolute: 0.6 10*3/uL (ref 0.1–1.0)
Monocytes Relative: 6 %
NRBC: 0 % (ref 0.0–0.2)
Neutro Abs: 7.4 10*3/uL (ref 1.7–7.7)
Neutrophils Relative %: 76 %
Platelets: 300 10*3/uL (ref 150–400)
RBC: 3.86 MIL/uL — AB (ref 3.87–5.11)
RDW: 14.4 % (ref 11.5–15.5)
WBC: 9.7 10*3/uL (ref 4.0–10.5)

## 2018-07-15 LAB — COMPREHENSIVE METABOLIC PANEL
ALT: 22 U/L (ref 0–44)
AST: 28 U/L (ref 15–41)
Albumin: 2.8 g/dL — ABNORMAL LOW (ref 3.5–5.0)
Alkaline Phosphatase: 48 U/L (ref 38–126)
Anion gap: 10 (ref 5–15)
BUN: <5 mg/dL — ABNORMAL LOW (ref 8–23)
CO2: 26 mmol/L (ref 22–32)
Calcium: 8.7 mg/dL — ABNORMAL LOW (ref 8.9–10.3)
Chloride: 98 mmol/L (ref 98–111)
Creatinine, Ser: 0.67 mg/dL (ref 0.44–1.00)
GFR calc Af Amer: >60 mL/min (ref >60)
GFR calc non Af Amer: >60 mL/min (ref >60)
Glucose, Bld: 151 mg/dL — ABNORMAL HIGH (ref 70–99)
Potassium: 3.5 mmol/L (ref 3.5–5.1)
Sodium: 134 mmol/L — ABNORMAL LOW (ref 135–145)
Total Bilirubin: 0.7 mg/dL (ref 0.3–1.2)
Total Protein: 5.9 g/dL — ABNORMAL LOW (ref 6.5–8.1)

## 2018-07-15 MED ORDER — PROCHLORPERAZINE MALEATE 5 MG PO TABS
5.0000 mg | ORAL_TABLET | Freq: Three times a day (TID) | ORAL | Status: DC
  Administered 2018-07-15 – 2018-07-16 (×5): 10 mg via ORAL
  Administered 2018-07-17: 5 mg via ORAL
  Administered 2018-07-17: 10 mg via ORAL
  Administered 2018-07-18: 5 mg via ORAL
  Filled 2018-07-15 (×7): qty 2
  Filled 2018-07-15 (×2): qty 1
  Filled 2018-07-15: qty 2

## 2018-07-15 MED ORDER — PROCHLORPERAZINE EDISYLATE 10 MG/2ML IJ SOLN
5.0000 mg | Freq: Three times a day (TID) | INTRAMUSCULAR | Status: DC
  Filled 2018-07-15: qty 2

## 2018-07-15 MED ORDER — PROCHLORPERAZINE 25 MG RE SUPP: 12.5000 mg | Freq: Three times a day (TID) | RECTAL | Status: DC

## 2018-07-15 NOTE — Progress Notes (Signed)
Family concern about patient's nausea and vomiting per family this has been going on before the stroke. MD notified new orders noted.

## 2018-07-15 NOTE — Progress Notes (Signed)
[facility] PHYSICAL MEDICINE & REHABILITATION PROGRESS NOTE   Subjective/Complaints: Has had nausea associated with eating and has been taking medication for this prior to admission           ROS: Positive for nausea no vomiting today, no chest pain or shortness of breath   Objective:   No results found. Recent Labs    07/15/18 0905  WBC 9.7  HGB 11.1*  HCT 34.2*  PLT 300   Recent Labs    07/15/18 0905  NA 134*  K 3.5  CL 98  CO2 26  GLUCOSE 151*  BUN <5*  CREATININE 0.67  CALCIUM 8.7*    Intake/Output Summary (Last 24 hours) at 07/15/2018 1219 Last data filed at 07/15/2018 0321 Gross per 24 hour  Intake -  Output 350 ml  Net -350 ml     Physical Exam: Vital Signs Blood pressure (!) 136/56, pulse 64, temperature 98.5 F (36.9 C), temperature source Oral, resp. rate 18, height 5\' 1"  (1.549 m), weight 76.8 kg, SpO2 97 %.  Head- s/p Right craniotomy fronto-parietal General: No acute distress Mood and affect are appropriate Heart: Regular rate and rhythm no rubs murmurs or extra sounds Lungs: Clear to auscultation, breathing unlabored, no rales or wheezes Abdomen: Positive bowel sounds, soft nontender to palpation, nondistended Extremities: No clubbing, cyanosis, or edema Skin: No evidence of breakdown, no evidence of rash Neurologic: Cranial nerves II through XII intact, motor strength is 5/5 in bilateral deltoid, bicep, tricep, grip, hip flexor, knee extensors, ankle dorsiflexor and plantar flexor Sensory exam normal sensation to light touch and proprioception in bilateral upper and lower extremities Cerebellar exam normal finger to nose to finger as well as heel to shin in bilateral upper and lower extremities Musculoskeletal: Full range of motion in all 4 extremities. No joint swelling   Assessment/Plan: 1. 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 which require 3+ hours per day of interdisciplinary therapy in a comprehensive inpatient rehab  setting.  Physiatrist is providing close team supervision and 24 hour management of active medical problems listed below.  Physiatrist and rehab team continue to assess barriers to discharge/monitor patient progress toward functional and medical goals  Care Tool:  Bathing              Bathing assist       Upper Body Dressing/Undressing Upper body dressing        Upper body assist      Lower Body Dressing/Undressing Lower body dressing            Lower body assist       Toileting Toileting    Toileting assist Assist for toileting: Minimal Assistance - Patient > 75%     Transfers Chair/bed transfer  Transfers assist           Locomotion Ambulation   Ambulation assist              Walk 10 feet activity   Assist           Walk 50 feet activity   Assist           Walk 150 feet activity   Assist           Walk 10 feet on uneven surface  activity   Assist           Wheelchair     Assist               Wheelchair 50 feet with 2  turns activity    Assist            Wheelchair 150 feet activity     Assist            Medical Problem List and Plan: 1.Functional deficitssecondary to SDH--repeat CT for 1/6 per NS. -CIR PT OT speech eval's 2. DVT Prophylaxis/Anticoagulation: Mechanical:Sequential compression devices, below kneeBilateral lower extremities 3.Headaches/Pain Management:Tylenol and then tramadol as first line for headache.  -Willusehydrocodoneonly for sever pain due to concern over nausea.  -Discontinue IV morphine.  -May need Topamax for ongoing HA. 4. Mood:LCSW to follow for evaluation and support. 5. Neuropsych: This patientiscapable of making decisions on herown behalf. 6. Skin/Wound Care:Monitor scalp for healing. Maintain adequate nutritional and hydration status. 7.  Fluids/Electrolytes/Nutrition:Monitor I/O. Check lytes in am. D/c IVF and encourage fluid intake.  8. HTN: Continue Monitor qid with SBP goals <160. Will add prn clonidine in place of labetalol.  Vitals:   07/14/18 2314 07/15/18 0305  BP: (!) 162/82 (!) 136/56  Pulse: (!) 58 64  Resp:  18  Temp:  98.5 F (36.9 C)  SpO2:  97%   9. Nausea/Vomiting: Continues to be an issue--question acute on chronic.Worse recently with hydrocodone.Had negative swallow study few months ago. Continue PPI. Question due to constipation. Administer two dose Miralax today. Compazine prn.  10. Hypokalemia: Recheck labs in am.  Resolved potassium 3.5 on 07/15/2018 11. Leukocytosis:    Resolved WBC 9.7 K on 1/4  LOS: 1 days A FACE TO FACE EVALUATION WAS PERFORMED  Erick Colace 07/15/2018, 12:19 PM

## 2018-07-15 NOTE — Evaluation (Signed)
Speech Language Pathology Assessment and Plan  Patient Details  Name: Dana Porter MRN: [id number] Date of Birth: 1934/06/27  SLP Diagnosis: Cognitive Impairments  Rehab Potential: Good ELOS: 5 to 7 days    Today's Date: 07/15/2018 SLP Individual Time: 0930-1030 SLP Individual Time Calculation (min): 60 min   Problem List:  Patient Active Problem List   Diagnosis Date Noted  . SDH (subdural hematoma) ([place]) 07/14/2018  . Subdural hematoma ([place]) 07/11/2018  . Status post surgery 07/10/2018   Past Medical History:  Past Medical History:  Diagnosis Date  . HTN (hypertension)   . Hypothyroid   . OA (osteoarthritis) of knee    Past Surgical History:  Past Surgical History:  Procedure Laterality Date  . ANKLE SURGERY Right    due to fracture  . CRANIOTOMY Right 07/10/2018   Procedure: CRANIOTOMY HEMATOMA EVACUATION SUBDURAL;  Surgeon: Jovita Gamma, MD;  Location: [place];  Service: Neurosurgery;  Laterality: Right;  . LAPAROSCOPIC CHOLECYSTECTOMY    . OVARIAN CYST REMOVAL    . TOTAL KNEE ARTHROPLASTY Bilateral     Assessment / Plan / Recommendation Clinical Impression Dana Porter is an 85 year old female with history of HTN, daily falls X 3 days with onset of left sided weakness on 07/10/18. 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 She was taken to OR emergently for right fronto-parietal craniotomy for evacuation of SDH by Dr. Rita Ohara. Follow up CT head revealed decrease in size of SDH with marked improvement in midline shift and mild pneumocephalus. She continues to have issues with HA, bouts of vomiting, left sided weakness and poor safety awareness with distractibility. CIR recommended due to functional deficits with admission on 07/14/18.   Cognitive linguistic evaluation provided on 07/15/18. Pt presents with mild higher level cognitive deficits that impact complex  problem solving, higher level recall of information, anticipatory awareness and self-monitoring verbose tangential conversations. Although pt's tangential conversations are pleasant, suspect they will distract form completing higher level tasks especially those performed in distracting environment. Additionally, pt demonstrated tendency to over-react/or intensify situations. For example, she saw a crumb in her bed and immediately jerks off her sock to mae sure he crumb isn't a bug. As such, skilled ST is required to target these deficits and increase functional independence to reduce caregiver burden. Anticipate that pt will require some form of upervision at [facility] but don't feel she will need any follow-up ST.    Skilled Therapeutic Interventions          Skilled treatment session focused on completion of above mentioned assessment, SLP aware that pt was not able to recall the purpose of some of her medicines. Therefore SLP further facilitated session by providing written list of current medicines and te purpose of each medicine. Pt able to recall medicine list and purpose with Min A and Min A to decrease verbosity of side-effects related to each medicine. Pt demonstrated good afety awareness as she reuested help ambulating to bathroom for continent urine and also wanted all 4 bedrails up (just so she won't fall out of bed).  SLP left pt eating saltines and drinking gingerale to help with her nausea.    SLP Assessment  Patient will need skilled [facility] during CIR admission    Recommendations  Recommendations for Other Services: Neuropsych consult Patient destination: Home Follow up Recommendations: 24 hour supervision/assistance Equipment Recommended: None recommended by  SLP    SLP Frequency 3 to 5 out of 7 days   SLP Duration  SLP Intensity  SLP Treatment/Interventions 5 to 7 days  Minumum of 1-2 x/day, 30 to 90 minutes  Cognitive  remediation/compensation;Patient/family education;Functional tasks;Medication managment    Pain Pain Assessment Pain Scale: 0-10 Pain Score: 7  Pain Type: Acute pain Pain Location: Abdomen Pain Orientation: Left Pain Descriptors / Indicators: Discomfort Pain Frequency: Intermittent Pain Onset: On-going Pain Intervention(s): Medication (See eMAR)  Prior Functioning Cognitive/Linguistic Baseline: Within functional limits Type of Home: House  Lives With: Spouse Available Help at Discharge: Family;Available 24 hours/day Vocation: Retired  Industrial/product designer Term Goals: Week 1: SLP Short Term Goal 1 (Week 1): Pt will demonstrate selective attention in moderately distracting environment for ~ 30 minutes with supervision cues.  SLP Short Term Goal 2 (Week 1): Pt will complete complex problem solving tasks with supervision cues.  SLP Short Term Goal 3 (Week 1): Pt will recall list of medication and their purpose with supervision cues.  SLP Short Term Goal 4 (Week 1): Pt will demonstrate anticipatory awareness by identify 3 safe and 3 unsafe activities she can perform within home environment with supervision cues.  SLP Short Term Goal 5 (Week 1): Pt will self-monitor off-topic distracting comments in 5 out of 10 opportunities with Min A cues.   Refer to Care Plan for Long Term Goals  Recommendations for other services: Neuropsych  Discharge Criteria: Patient will be discharged from SLP if patient refuses treatment 3 consecutive times without medical reason, if treatment goals not met, if there is a change in medical status, if patient makes no progress towards goals or if patient is discharged from hospital.  The above assessment, treatment plan, treatment alternatives and goals were discussed and mutually agreed upon: by patient  Dana Porter 07/15/2018, 12:44 PM

## 2018-07-15 NOTE — Evaluation (Signed)
Occupational Therapy Assessment and Plan  Patient Details  Name: Dana Porter MRN: [id number] Date of Birth: April 12, 1934  OT Diagnosis: altered mental status, cognitive deficits, hemiplegia affecting non-dominant side and muscle weakness (generalized) Rehab Potential: Rehab Potential (ACUTE ONLY): Good ELOS: 5-7   Today's Date: 07/15/2018 OT Individual Time: 0973-5329 OT Individual Time Calculation (min): 75 min     Problem List:  Patient Active Problem List   Diagnosis Date Noted  . SDH (subdural hematoma) ([place]) 07/14/2018  . Subdural hematoma ([place]) 07/11/2018  . Status post surgery 07/10/2018    Past Medical History:  Past Medical History:  Diagnosis Date  . HTN (hypertension)   . Hypothyroid   . OA (osteoarthritis) of knee    Past Surgical History:  Past Surgical History:  Procedure Laterality Date  . ANKLE SURGERY Right    due to fracture  . CRANIOTOMY Right 07/10/2018   Procedure: CRANIOTOMY HEMATOMA EVACUATION SUBDURAL;  Surgeon: Jovita Gamma, MD;  Location: [place];  Service: Neurosurgery;  Laterality: Right;  . LAPAROSCOPIC CHOLECYSTECTOMY    . OVARIAN CYST REMOVAL    . TOTAL KNEE ARTHROPLASTY Bilateral     Assessment & Plan Clinical Impression:  Dana Porter is an 85 year old female with history of HTN, daily falls X 3 days with onset of left sided weakness on 07/10/18. 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 She was taken to OR emergently for right fronto-parietal craniotomy for evacuation of SDH by Dr. Rita Ohara. Follow up CT head revealed decrease in size of SDH with marked improvement in midline shift and mild pneumocephalus. She continues to have issues with HA, bouts of vomiting, left sided weakness and poor safety awareness with distractibility. CIR recommended due to functional deficits.   Patient currently requires min with basic self-care skills  secondary to muscle weakness, decreased cardiorespiratoy endurance, decreased coordination, decreased attention, decreased awareness, decreased problem solving, decreased safety awareness and decreased memory and decreased standing balance, hemiplegia and decreased balance strategies.  Prior to hospitalization, patient could complete BADL with supervision.  Patient will benefit from skilled intervention to decrease level of assist with basic self-care skills prior to discharge home with care partner.  Anticipate patient will require 24 hour supervision and follow up home health.  OT - End of Session Activity Tolerance: Tolerates 30+ min activity with multiple rests Endurance Deficit: Yes OT Assessment Rehab Potential (ACUTE ONLY): Good OT Patient demonstrates impairments in the following area(s): Balance;Cognition;Endurance;Motor;Nutrition;Perception;Safety;Vision OT Basic ADL's Functional Problem(s): Grooming;Bathing;Dressing;Toileting OT Transfers Functional Problem(s): Toilet;Tub/Shower OT Plan OT Intensity: Minimum of 1-2 x/day, 45 to 90 minutes OT Frequency: 5 out of 7 days OT Duration/Estimated Length of Stay: 5-7 OT Treatment/Interventions: Balance/vestibular training;Functional electrical stimulation;Neuromuscular re-education;Patient/family education;Self Care/advanced ADL retraining;Splinting/orthotics;Therapeutic Exercise;UE/LE Coordination activities;Cognitive remediation/compensation;Discharge planning;DME/adaptive equipment instruction;Functional mobility training;Pain management;Community reintegration;Psychosocial support;Skin care/wound managment;Therapeutic Activities;UE/LE Strength taining/ROM OT Self Feeding Anticipated Outcome(s): MOD I OT Basic Self-Care Anticipated Outcome(s): MOD I  OT Toileting Anticipated Outcome(s):  MOD I OT Bathroom Transfers Anticipated Outcome(s): MOD I OT Recommendation Recommendations for Other Services: Speech consult Patient destination:  Home Follow Up Recommendations: Home health OT;Outpatient OT Equipment Recommended: 3 in 1 bedside comode   Skilled Therapeutic Intervention 1:1. Pt received in bed and educated on tole/purpose of OT, CIR, ELOS and POC. Pt completes all mobility with CGA and no AD and is able to maintain static standing balance iwht no UE support during functional tasks  with S. Pt able to sequence and organize ADLs with min VC. Pt requires min VC for seated threading BLE into pants as well as bathing for energy conservation. Pt would benefit from [facility] in shower at home for energy conservation and discussed this with husband and daughter who entered session half way through. Pt able to stand and groom at sink with S but requires prolonged rest breaks d/t fatigue. Exited session with pt seated in bed, call light in reach and all needs met  OT Evaluation Precautions/Restrictions  Precautions Precautions: Fall Precaution Comments: jp drain from R pariental/s/p crani Restrictions Weight Bearing Restrictions: No General Chart Reviewed: Yes Vital Signs  Pain Pain Assessment Pain Scale: 0-10 Pain Score: 0-No pain Pain Type: Acute pain;Surgical pain Pain Location: Head Pain Orientation: Right Pain Descriptors / Indicators: Aching Pain Frequency: Intermittent Pain Onset: Gradual Pain Intervention(s): Medication (See eMAR) Home Living/Prior Functioning Home Living Family/patient expects to be discharged to:: Private residence Living Arrangements: Spouse/significant other, Children Available Help at Discharge: Family, Available 24 hours/day Type of Home: House Home Access: Stairs to enter [employer] of Steps: 2 Entrance Stairs-Rails: Can reach both Home Layout: One level Bathroom Shower/Tub: Tub/shower unit, Gaffer, Curtain(shower chair) Bathroom Toilet: Handicapped height Additional Comments: Pt and spouse are wintering in [place] at their daughter's.  They are from [place].   Spouse will be available to assist pt at discharge, and daughter can take FMLA  IADL History Homemaking Responsibilities: Yes Meal Prep Responsibility: Secondary Laundry Responsibility: Secondary Cleaning Responsibility: Secondary Bill Paying/Finance Responsibility: Secondary Shopping Responsibility: Secondary Current License: Yes Mode of Transportation: Car Leisure and Hobbies: used to crochet and sew; Prior Function Level of Independence: Independent with basic ADLs, Independent with homemaking with ambulation  Able to Take Stairs?: Yes Driving: Yes Comments: although has had several falls recently ADL ADL Grooming: Supervision/safety Upper Body Bathing: Supervision/safety Where Assessed-Upper Body Bathing: Shower Lower Body Bathing: Supervision/safety Where Assessed-Lower Body Bathing: Shower Upper Body Dressing: Setup Lower Body Dressing: Minimal cueing Where Assessed-Lower Body Dressing: Sitting at sink Where Assessed-Toileting: Glass blower/designer: Therapist, music Method: Product/process development scientist Method: Ambulating Vision Baseline Vision/History: No visual deficits Patient Visual Report: No change from baseline Vision Assessment?: Yes Eye Alignment: Within Functional Limits Ocular Range of Motion: Within Functional Limits Alignment/Gaze Preference: Within Defined Limits Tracking/Visual Pursuits: Able to track stimulus in all quads without difficulty Saccades: Undershoots;Decreased speed of saccadic movement Visual Fields: Other (comment) Additional Comments: pt frequently distracted and tangential distracting pt during formal assessment Perception    WFL Praxis   WFL Cognition Overall Cognitive Status: Impaired/Different from baseline Orientation Level: Person;Place;Situation Person: Oriented Place: Oriented Situation: Oriented Year: 2020 Month: January Day of Week: Incorrect Memory: Appears  intact Immediate Memory Recall: Sock;Blue;Bed Memory Recall: [employer] [place] [employer] Scales of Cognitive Functioning: Automatic/appropriate Sensation Sensation Light Touch: Appears Intact Coordination Gross Motor Movements are Fluid and Coordinated: Yes Fine Motor Movements are Fluid and Coordinated: Yes Finger Nose Finger Test: L hand undershoots Motor  Motor Motor: Hemiplegia Motor - Skilled Clinical Observations: mild L hemi Mobility    stand pivot transfers/ambualtory transfers: CGA no AD Trunk/Postural Assessment  Cervical Assessment Cervical Assessment: Within Functional Limits Thoracic Assessment Thoracic Assessment: Within Functional Limits Lumbar Assessment Lumbar Assessment: Within Functional Limits Postural Control Postural Control: Within Functional Limits  Balance Balance Balance Assessed: Yes Dynamic Sitting Balance Sitting balance - Comments: close supervision EOB.  Dynamic Standing Balance Dynamic Standing - Level of  Assistance: 4: Min assist Extremity/Trunk Assessment RUE Assessment RUE Assessment: Within Functional Limits LUE Assessment LUE Assessment: Exceptions to [facility] General Strength Comments: generalized weakness     Refer to Care Plan for Long Term Goals  Recommendations for other services: Therapeutic Recreation  Pet therapy, Kitchen group and Outing/community reintegration   Discharge Criteria: Patient will be discharged from OT if patient refuses treatment 3 consecutive times without medical reason, if treatment goals not met, if there is a change in medical status, if patient makes no progress towards goals or if patient is discharged from hospital.  The above assessment, treatment plan, treatment alternatives and goals were discussed and mutually agreed upon: by patient and by family  Tonny Branch 07/15/2018, 7:54 AM

## 2018-07-15 NOTE — Evaluation (Addendum)
Physical Therapy Assessment and Plan  Patient Details  Name: Dana Porter MRN: [id number] Date of Birth: 26-Jul-1933  PT Diagnosis: Abnormality of gait, Cognitive deficits, Difficulty walking, Impaired cognition and Muscle weakness Rehab Potential: Excellent ELOS: 5-7 days   Today's Date: 07/15/2018 PT Individual Time: 1300-1400 PT Individual Time Calculation (min): 60 min    Problem List:  Patient Active Problem List   Diagnosis Date Noted  . SDH (subdural hematoma) ([place]) 07/14/2018  . Subdural hematoma ([facility] [place]) 07/11/2018  . Status post surgery 07/10/2018    Past Medical History:  Past Medical History:  Diagnosis Date  . HTN (hypertension)   . Hypothyroid   . OA (osteoarthritis) of knee    Past Surgical History:  Past Surgical History:  Procedure Laterality Date  . ANKLE SURGERY Right    due to fracture  . CRANIOTOMY Right 07/10/2018   Procedure: CRANIOTOMY HEMATOMA EVACUATION SUBDURAL;  Surgeon: Jovita Gamma, MD;  Location: [facility];  Service: Neurosurgery;  Laterality: Right;  . LAPAROSCOPIC CHOLECYSTECTOMY    . OVARIAN CYST REMOVAL    . TOTAL KNEE ARTHROPLASTY Bilateral     Assessment & Plan Clinical Impression: Patient is a 85 year old female with history of HTN, daily falls X 3 days with onset of left sided weakness on 07/10/18. 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 She was taken to OR emergently for right fronto-parietal craniotomy for evacuation of SDH by Dr. Rita Ohara. Follow up CT head revealed decrease in size of SDH with marked improvement in midline shift and mild pneumocephalus. She continues to have issues with HA, bouts of vomiting, left sided weakness and poor safety awareness with distractibility. CIR recommended due to functional deficits. Patient transferred to CIR on 07/14/2018 .   Patient currently requires min with mobility secondary to muscle  weakness, decreased cardiorespiratoy endurance, decreased attention, decreased problem solving, decreased safety awareness, decreased memory and delayed processing and decreased standing balance, decreased postural control and decreased balance strategies.  Prior to hospitalization, patient was independent  with mobility and lived with Spouse, Daughter(planning to d/c to daughter's house for 24/7 assist from daughter and spouse) in a House home.  Home access is 2Stairs to enter.  Patient will benefit from skilled PT intervention to maximize safe functional mobility, minimize fall risk and decrease caregiver burden for planned discharge home with 24 hour supervision.  Anticipate patient will benefit from follow up OP at discharge.  PT - End of Session Activity Tolerance: Tolerates < 10 min activity, no significant change in vital signs Endurance Deficit: Yes Endurance Deficit Description: increased work of breathing w/ all activity, requests to rest frequently PT Assessment Rehab Potential (ACUTE/IP ONLY): Excellent PT Barriers to Discharge: Behavior PT Barriers to Discharge Comments: restless, mild impulsivity, verbose and moves quickly w/ decreased safety awareness PT Patient demonstrates impairments in the following area(s): Balance;Behavior;Endurance;Motor;Safety PT Transfers Functional Problem(s): Bed to Chair;Bed Mobility;Car;Furniture;Floor PT Locomotion Functional Problem(s): Stairs;Ambulation PT Plan PT Intensity: Minimum of 1-2 x/day ,45 to 90 minutes PT Frequency: 5 out of 7 days PT Duration Estimated Length of Stay: 5-7 days PT Treatment/Interventions: Ambulation/gait training;Disease management/prevention;Pain management;Stair training;Visual/perceptual remediation/compensation;Wheelchair propulsion/positioning;Therapeutic Activities;Patient/family education;Cognitive remediation/compensation;Balance/vestibular training;DME/adaptive equipment instruction;Psychosocial  support;Therapeutic Exercise;Community reintegration;Functional mobility training;Skin care/wound management;UE/LE Strength taining/ROM;UE/LE Coordination activities;Splinting/orthotics;Neuromuscular re-education;Discharge planning PT Transfers Anticipated Outcome(s): supervision PT Locomotion Anticipated Outcome(s): supervision household gait w/ LRAD PT Recommendation Follow Up Recommendations: Outpatient PT Patient destination: Home(daughter's house w/  daughter and spouse) Equipment Recommended: To be determined  Skilled Therapeutic Intervention  Pt in supine and agreeable to therapy, denies pain. Total assist w/c transport to/from therapy gym for energy conservation. Performed functional mobility as detailed below including bed mobility, transfers, gait, stair negotiation, and car transfer. All mobility performed at [facility] level and verbal cues for safety awareness, to decrease speed, and for attention to task. Pt verbose and talkative throughout session, however easily redirected. Ambulated 80' w/o AD and ~100' w/ RW. Pt w/ increased gait speed w/ or w/o AD, however demonstrated less work of breathing w/ RW. Increased work of breathing w/ all mobility, pt frequently stated "I'm very tired today". Will continue to work towards safest but least restrictive AD. Instructed pt in results of PT evaluation as detailed below, PT POC, rehab potential, rehab goals, and discharge recommendations. Pt verbalized understanding and in agreement. No family members present at evaluation, will update them as they become available. Ended session in supine, all needs in reach. All 4 bed rails up per pt's request.   PT Evaluation Precautions/Restrictions Precautions Precautions: Fall Precaution Comments: jp drain from R pariental/s/p crani Restrictions Weight Bearing Restrictions: No General   Vital SignsTherapy Vitals Pulse Rate: 79 Resp: 16 BP: (!) 147/71 Patient Position (if appropriate): Lying Oxygen  Therapy SpO2: 97 % O2 Device: Room Air Pain Pain Assessment Pain Score: 0-No pain Home Living/Prior Functioning Home Living Available Help at Discharge: Family;Available 24 hours/day Type of Home: House Home Access: Stairs to enter [employer] of Steps: 2 Entrance Stairs-Rails: Can reach both Home Layout: One level Bathroom Shower/Tub: Tub/shower unit;Walk-in shower;Curtain Bathroom Toilet: Handicapped height Additional Comments: Pt and spouse are wintering in [place] at their daughter's.  They are from [place].  Spouse will be available to assist pt at discharge, and daughter can take FMLA   Lives With: Spouse;Daughter(planning to d/c to daughter's house for 24/7 assist from daughter and spouse) Prior Function Level of Independence: Independent with basic ADLs;Independent with homemaking with ambulation;Independent with transfers;Independent with gait  Able to Take Stairs?: Yes Driving: Yes Vocation: Retired Comments: active at her church, enjoys gardening, was having multiple falls daily PTA Vision/Perception  Perception Perception: Within Functional Limits Praxis Praxis: Intact  Cognition Overall Cognitive Status: Impaired/Different from baseline Arousal/Alertness: Awake/alert Orientation Level: Oriented X4 Selective Attention: Impaired Memory: Impaired Memory Impairment: Decreased short term memory Awareness: Impaired Problem Solving: Impaired Behaviors: Restless;Impulsive(very verbose and talkative, easily redirected) Safety/Judgment: Appears intact Sensation Sensation Light Touch: Appears Intact Coordination Gross Motor Movements are Fluid and Coordinated: Yes Fine Motor Movements are Fluid and Coordinated: Yes Heel Shin Test: WNL bilaterally Motor  Motor Motor: Hemiplegia Motor - Skilled Clinical Observations: mild L hemi  Mobility Bed Mobility Bed Mobility: Rolling Right;Rolling Left;Supine to Sit;Sit to Supine Rolling Right:  Supervision/verbal cueing Rolling Left: Supervision/Verbal cueing Supine to Sit: Supervision/Verbal cueing Sit to Supine: Supervision/Verbal cueing Transfers Transfers: Sit to Stand;Stand to Sit;Stand Pivot Transfers Sit to Stand: Contact Guard/Touching assist Stand to Sit: Contact Guard/Touching assist Stand Pivot Transfers: Contact Guard/Touching assist Transfer (Assistive device): None Locomotion  Gait Ambulation: Yes Gait Assistance: Contact Guard/Touching assist Gait Distance (Feet): 75 Feet Assistive device: None Gait Gait: Yes Gait Pattern: Impaired Gait Pattern: Poor foot clearance - left;Shuffle;Poor foot clearance - right;Antalgic Gait velocity: increased, gait speed is unsafe, needs verbal cues to slow down Stairs / Additional Locomotion Stairs: Yes Stairs Assistance: Contact Guard/Touching assist Stair Management Technique: Two rails Number of Stairs: 4 Height of Stairs: 6 Product manager  Mobility: No  Trunk/Postural Assessment  Cervical Assessment Cervical Assessment: Within Functional Limits Thoracic Assessment Thoracic Assessment: Within Functional Limits Lumbar Assessment Lumbar Assessment: Within Functional Limits Postural Control Postural Control: Within Functional Limits  Balance Balance Balance Assessed: Yes Dynamic Sitting Balance Dynamic Sitting - Balance Support: No upper extremity supported;Feet supported;During functional activity Dynamic Sitting - Level of Assistance: 5: Stand by assistance Dynamic Standing Balance Dynamic Standing - Balance Support: During functional activity;No upper extremity supported Dynamic Standing - Level of Assistance: 4: Min assist(CGA) Extremity Assessment  RLE Assessment RLE Assessment: Within Functional Limits LLE Assessment LLE Assessment: Exceptions to [facility] Passive Range of Motion (PROM) Comments: WFL General Strength Comments: 4 to 4+/5 globally    Refer to Care Plan for Long Term  Goals  Recommendations for other services: None   Discharge Criteria: Patient will be discharged from PT if patient refuses treatment 3 consecutive times without medical reason, if treatment goals not met, if there is a change in medical status, if patient makes no progress towards goals or if patient is discharged from hospital.  The above assessment, treatment plan, treatment alternatives and goals were discussed and mutually agreed upon: by patient and No family available  Jamol Ginyard Clent Demark 07/15/2018, 5:08 PM

## 2018-07-16 MED ORDER — PANTOPRAZOLE SODIUM 40 MG PO TBEC
40.0000 mg | DELAYED_RELEASE_TABLET | Freq: Two times a day (BID) | ORAL | Status: DC
  Administered 2018-07-16 – 2018-07-21 (×9): 40 mg via ORAL
  Filled 2018-07-16 (×9): qty 1

## 2018-07-16 NOTE — Progress Notes (Signed)
[place] PHYSICAL MEDICINE & REHABILITATION PROGRESS NOTE   Subjective/Complaints: Discussed patient history with the patient as well as daughter and husband separately.  There has been a history of falls but no one has seen or hit her head.  According to neurosurgery the family states that some of these falls were a couple months ago.  Patient has history of reflux.  Unclear whether the falls caused some increased nausea.  Patient is able to eat and keep food down after taking Compazine  Patient coughs when she is drinking in the bed  Per family patient took a lot of Tums prior to admission         ROS: Positive for nausea no vomiting today, no chest pain or shortness of breath   Objective:   No results found. Recent Labs    07/15/18 0905  WBC 9.7  HGB 11.1*  HCT 34.2*  PLT 300   Recent Labs    07/15/18 0905  NA 134*  K 3.5  CL 98  CO2 26  GLUCOSE 151*  BUN <5*  CREATININE 0.67  CALCIUM 8.7*    Intake/Output Summary (Last 24 hours) at 07/16/2018 1123 Last data filed at 07/16/2018 0900 Gross per 24 hour  Intake 840 ml  Output -  Net 840 ml     Physical Exam: Vital Signs Blood pressure 139/69, pulse 65, temperature 98.1 F (36.7 C), temperature source Oral, resp. rate 16, height 5\' 1"  (1.549 m), weight 76.8 kg, SpO2 97 %.  Head- s/p Right craniotomy fronto-parietal General: No acute distress Mood and affect are appropriate Heart: Regular rate and rhythm no rubs murmurs or extra sounds Lungs: Clear to auscultation, breathing unlabored, no rales or wheezes Abdomen: Positive bowel sounds, soft nontender to palpation, nondistended Extremities: No clubbing, cyanosis, or edema Skin: No evidence of breakdown, no evidence of rash Neurologic: Cranial nerves II through XII intact, motor strength is 5/5 in bilateral deltoid, bicep, tricep, grip, hip flexor, knee extensors, ankle dorsiflexor and plantar flexor Sensory exam normal sensation to light touch and proprioception  in bilateral upper and lower extremities Cerebellar exam normal finger to nose to finger as well as heel to shin in bilateral upper and lower extremities Musculoskeletal: Full range of motion in all 4 extremities. No joint swelling   Assessment/Plan: 1. 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  Physiatrist is providing close team supervision and 24 hour management of active medical problems listed below.  Physiatrist and rehab team continue to assess barriers to discharge/monitor patient progress toward functional and medical goals  Care Tool:  Bathing    Body parts bathed by patient: Right arm, Left arm, Chest, Abdomen, Front perineal area, Buttocks, Right upper leg, Left upper leg, Right lower leg, Left lower leg, Face         Bathing assist Assist Level: Contact Guard/Touching assist     Upper Body Dressing/Undressing Upper body dressing   What is the patient wearing?: Pull over shirt    Upper body assist Assist Level: Set up assist    Lower Body Dressing/Undressing Lower body dressing      What is the patient wearing?: Underwear/pull up, Pants     Lower body assist Assist for lower body dressing: Contact Guard/Touching assist     Toileting Toileting    Toileting assist Assist for toileting: Contact Guard/Touching assist     Transfers Chair/bed transfer  Transfers assist  Chair/bed transfer assist level: Contact Guard/Touching assist     Locomotion Ambulation   Ambulation assist      Assist level: Contact Guard/Touching assist Assistive device: Other (comment)(none) Max distance: 19'   Walk 10 feet activity   Assist     Assist level: Contact Guard/Touching assist Assistive device: Other (comment)(none)   Walk 50 feet activity   Assist    Assist level: Contact Guard/Touching assist Assistive device: Other  (comment)(none)    Walk 150 feet activity   Assist Walk 150 feet activity did not occur: Safety/medical concerns         Walk 10 feet on uneven surface  activity   Assist Walk 10 feet on uneven surfaces activity did not occur: Safety/medical concerns         Wheelchair     Assist Will patient use wheelchair at discharge?: No   Wheelchair activity did not occur: N/A         Wheelchair 50 feet with 2 turns activity    Assist    Wheelchair 50 feet with 2 turns activity did not occur: N/A       Wheelchair 150 feet activity     Assist Wheelchair 150 feet activity did not occur: N/A          Medical Problem List and Plan: 1.Functional deficitssecondary to SDH--repeat CT for 1/6 per NS. -CIR PT OT speech eval's 2. DVT Prophylaxis/Anticoagulation: Mechanical:Sequential compression devices, below kneeBilateral lower extremities 3.Headaches/Pain Management:Tylenol and then tramadol as first line for headache.  -Willusehydrocodoneonly for sever pain due to concern over nausea.  -Discontinue IV morphine.  -May need Topamax for ongoing HA. 4. Mood:LCSW to follow for evaluation and support. 5. Neuropsych: This patientiscapable of making decisions on herown behalf. 6. Skin/Wound Care:Monitor scalp for healing. Maintain adequate nutritional and hydration status. 7. Fluids/Electrolytes/Nutrition:Monitor I/O. Check lytes in am. D/c IVF and encourage fluid intake.  Intake improved today 480 mL in the morning 8. HTN: Continue Monitor qid with SBP goals <160. Will add prn clonidine in place of labetalol.  Vitals:   07/16/18 0419 07/16/18 0630  BP: (!) 171/86 139/69  Pulse: 73 65  Resp: 16   Temp: 98.1 F (36.7 C)   SpO2: 97%   Controlled today 9. Nausea/Vomiting: Continues to be an issue--question acute on chronic.Worse recently with hydrocodone.Had negative swallow study few months  ago. Continue PPI. Question due to constipation. Administer two dose Miralax today. Compazine prn.  Recommend GI eval as outpatient, seated in wheelchair for all meals 10. Hypokalemia: Recheck labs in am.  Resolved potassium 3.5 on 07/15/2018   LOS: 2 days A FACE TO FACE EVALUATION WAS PERFORMED  Erick Colace 07/16/2018, 11:23 AM

## 2018-07-16 NOTE — Progress Notes (Signed)
Per Dr. Jodean Lima instruction, cold washcloth applied on R face.  Noted reduction in swelling/edema of R periorbital/cheek. Patient laying on L side.

## 2018-07-16 NOTE — Progress Notes (Signed)
Noted patient's R periorbital area and R cheek are swollen/edematous. Patient denied pain in the head or face, no vision changes, no dizziness, no eye redness. Head incision clean, dry, and intact. Pt stated she has more drainage coming from R eye. Vital signs and neuro check done. BP 171/86. Clonidine tab given. Pt instructed to lay on her back with HOB elevated and to call for any changes or other discomfort. Charge RN informed. Dr. Doroteo Bradford called and awaiting for call back.

## 2018-07-17 ENCOUNTER — Inpatient Hospital Stay (HOSPITAL_COMMUNITY): Payer: Medicare Other

## 2018-07-17 ENCOUNTER — Inpatient Hospital Stay (HOSPITAL_COMMUNITY): Payer: Medicare Other | Admitting: Speech Pathology

## 2018-07-17 ENCOUNTER — Inpatient Hospital Stay (HOSPITAL_COMMUNITY): Payer: Medicare Other | Admitting: Occupational Therapy

## 2018-07-17 ENCOUNTER — Inpatient Hospital Stay (HOSPITAL_COMMUNITY): Payer: Medicare Other | Admitting: Physical Therapy

## 2018-07-17 NOTE — Progress Notes (Signed)
Social Work  Social Work Assessment and Plan  Patient Details  Name: Dana Porter MRN: [id number] Date of Birth: 08-11-1933  Today's Date: 07/17/2018  Problem List:  Patient Active Problem List   Diagnosis Date Noted  . SDH (subdural hematoma) ([facility]) 07/14/2018  . Subdural hematoma ([facility]) 07/11/2018  . Status post surgery 07/10/2018   Past Medical History:  Past Medical History:  Diagnosis Date  . HTN (hypertension)   . Hypothyroid   . OA (osteoarthritis) of knee    Past Surgical History:  Past Surgical History:  Procedure Laterality Date  . ANKLE SURGERY Right    due to fracture  . CRANIOTOMY Right 07/10/2018   Procedure: CRANIOTOMY HEMATOMA EVACUATION SUBDURAL;  Surgeon: Shirlean Kelly, MD;  Location: [facility] OR;  Service: Neurosurgery;  Laterality: Right;  . LAPAROSCOPIC CHOLECYSTECTOMY    . OVARIAN CYST REMOVAL    . TOTAL KNEE ARTHROPLASTY Bilateral    Social History:  reports that she has never smoked. She has never used smokeless tobacco. She reports that she does not drink alcohol or use drugs.  Family / Support Systems Marital Status: Married Patient Roles: Spouse, Parent Spouse/Significant Other: Simona Huh [phone] Children: Cynthia-daughter [phone] Other Supports: Three other grown children all spread out over the [place] Anticipated Caregiver: Daughter and pt's husband will be available to assist and church members Ability/Limitations of Caregiver: Supervision/min Caregiver Availability: 24/7 Family Dynamics: Close knit family here visiting daughter from [place] for the winter. They have three grown children who are involved but in other states. They have friends and church members back home in [place] who are involved.   Social History Preferred language: English Religion: Unknown Cultural Background: No issues Education: High School Read: Yes Write: Yes Employment Status: Retired Marine scientist Issues: No  issues Guardian/Conservator: None-according to MD pt is not fully capable of making her own decisions while here. Husband is here daily along with daughter to provide support to pt. Defer to husband is any decisions need to be made while here   Abuse/Neglect Abuse/Neglect Assessment Can Be Completed: Yes Physical Abuse: Denies Verbal Abuse: Denies Sexual Abuse: Denies Exploitation of patient/patient's resources: Denies Self-Neglect: Denies  Emotional Status Pt's affect, behavior and adjustment status: Pt and husband report she is a very stubborn, independent lady and does for herself. Husband voiced now she will need to be more careful than she was being. He plans to always have his eyes on her when she leaves here. Recent Psychosocial Issues: other health issues were managed by PCP in [place]-fairly healthy Psychiatric History: No history deferred depression screen due to coping appropriately and still recovering at times is unable to find the workd or it is the wrong word. Will monitor and see if needs to be seen by neuro-psych through her stay here. Will be short length of stay due to high level. Substance Abuse History: No issues  Patient / Family Perceptions, Expectations & Goals Pt/Family understanding of illness & functional limitations: Pt and husband can explain her surgery and blood clot in her head. Both have spoken with the MD and feel they have a good understanding of her treatment plan going forward.  Husband is here and will be here daily to see wife's progress. Premorbid pt/family roles/activities: Wife, mother, retiree, church member, grandmother, etc Anticipated changes in roles/activities/participation: resume Pt/family expectations/goals: Pt states: " I feel like I am doing pretty good for all of this."  Husband states: " I think she will do well but will need watching now, she moves  quickly."  [employer]: None Premorbid Home Care/DME Agencies:  None Transportation available at discharge: Family  Discharge Planning Living Arrangements: Spouse/significant other, Children Support Systems: Spouse/significant other, Children, Other relatives, Manufacturing engineer, Psychologist, clinical community Type of Residence: Private residence Insurance Resources: Media planner (specify)([employer]HC-Medicare) Surveyor, quantity Resources: Tree surgeon, Family Support Financial Screen Referred: No Living Expenses: Own Money Management: Spouse, Patient Does the patient have any problems obtaining your medications?: No Home Management: Self-pt did inside work and husband did the outside work Patient/Family Preliminary Plans: Return to daughter's home where husband and daughter can assist. Daughter does work during the day and care wil be husband. If daughter needs to take FMLA she can. She is a Stage manager. Pt is high level mobility wise and probably will be short length of stay. Have encouraged husband to observe pt in therapies and begin learning her care.  Social Work Anticipated Follow Up Needs: HH/OP, Support Group  Clinical Impression Pleasant female who is motivated to do well and realizes she has a ways to go to return to her baseline function. Her husband is involved and supportive and plans to be here daily. They plan to stay with daughter until she is doing better and MD follow ups completed. Will work on discharge needs and see when team feels ready for discharge.  Lucy Chris 07/17/2018, 12:23 PM

## 2018-07-17 NOTE — Care Management Note (Signed)
[facility] Individual Statement of Services  Patient Name:  Dana Porter  Date:  07/17/2018  Welcome to the [facility].  Our goal is to provide you with an individualized program based on your diagnosis and situation, designed to meet your specific needs.  With this comprehensive rehabilitation program, you will be expected to participate in at least 3 hours of rehabilitation therapies Monday-Friday, with modified therapy programming on the weekends.  Your rehabilitation program will include the following services:  Physical Therapy (PT), Occupational Therapy (OT), Speech Therapy (ST), 24 hour per day rehabilitation nursing, Therapeutic Recreaction (TR), Neuropsychology, Case Management (Social Worker), Rehabilitation Medicine, Nutrition Services and Pharmacy Services  Weekly team conferences will be held on Wednesday to discuss your progress.  Your Social Worker will talk with you frequently to get your input and to update you on team discussions.  Team conferences with you and your family in attendance may also be held.  Expected length of stay: 5-7 days  Overall anticipated outcome: supervision level due to safety issues  Depending on your progress and recovery, your program may change. Your Social Worker will coordinate services and will keep you informed of any changes. Your Social Worker's name and contact numbers are listed  below.  The following services may also be recommended but are not provided by the [facility]patient [facility]:   Driving Evaluations  Home Health Rehabiltiation Services  Outpatient Rehabilitation Services    Arrangements will be made to provide these services after discharge if needed.  Arrangements include referral to agencies that provide these services.  Your insurance has been verified to be:  [employer]-Medicare Your primary doctor is:  PCP in [place]  Pertinent information will be shared with your doctor and  your insurance company.  Social Worker:  Dossie Der, SW [phone] or [phone]  Information discussed with and copy given to patient by: Lucy Chris, 07/17/2018, 10:37 AM

## 2018-07-17 NOTE — Progress Notes (Signed)
Speech Language Pathology Daily Session Note  Patient Details  Name: Dana Porter MRN: [id number] Date of Birth: 02/07/1934  Today's Date: 07/17/2018 SLP Individual Time: 0815-0900 SLP Individual Time Calculation (min): 45 min  Short Term Goals: Week 1: SLP Short Term Goal 1 (Week 1): Pt will demonstrate selective attention in moderately distracting environment for ~ 30 minutes with supervision cues.  SLP Short Term Goal 2 (Week 1): Pt will complete complex problem solving tasks with supervision cues.  SLP Short Term Goal 3 (Week 1): Pt will recall list of medication and their purpose with supervision cues.  SLP Short Term Goal 4 (Week 1): Pt will demonstrate anticipatory awareness by identify 3 safe and 3 unsafe activities she can perform within home environment with supervision cues.  SLP Short Term Goal 5 (Week 1): Pt will self-monitor off-topic distracting comments in 5 out of 10 opportunities with Min A cues.   Skilled Therapeutic Interventions:  Skilled treatment session focused on cognition goals. SLP facilitiated session by administering Cognistat. Pt scored within functional limits on all tasks however subjectively pt with decreased efficiency in presence of distractions and when using working memory.   As a result, pt required Min A cues to complete complex daily scheduling task. She also required supervision cues to recall task that she was attempting when distracted by someone walking by. Of note, pt with perfect recall of purpose of each medicine as nurse presented them to her. Calendar left with pt as she stated that it was the 7th. Pt left in bed, bed alarm on and all needs within reach. Continue per current plan of care.   Pain Pain Assessment Pain Scale: 0-10 Pain Score: 0-No pain  Therapy/Group: Individual Therapy  Breslyn Abdo 07/17/2018, 10:23 AM

## 2018-07-17 NOTE — Progress Notes (Signed)
Occupational Therapy Session Note  Patient Details  Name: Dana Porter MRN: [id number] Date of Birth: 06-16-1934  Today's Date: 07/17/2018 OT Individual Time: 4695-0722 OT Individual Time Calculation (min): 40 min    Short Term Goals: Week 1:  OT Short Term Goal 1 (Week 1): n/a d/t ELOS  Skilled Therapeutic Interventions/Progress Updates:    Pt received sitting up in bed attempting to eat breakfast from poorly positioned tray. Pt was instructed in bed mobility to improve position for safe consumption of food. From flattened bed, pt able to pull herself up with cueing only. Pt's breakfast tray was set up to ensure access and pt briefly monitored to ensure safe swallowing. Pt able to don shirt and pants EOB with (S). Pt completed functional mobility into bathroom without AD for urgent BM. Pt able to complete all peri cleansing with close (S). Pt completed oral hygiene at sink with (S). Pt was quite verbose during session, requiring redirection to task. Pt returned to supine in bed with all needs met, bed alarm set.   Therapy Documentation Precautions:  Precautions Precautions: Fall Precaution Comments: jp drain from R pariental/s/p crani Restrictions Weight Bearing Restrictions: No Vital Signs: Therapy Vitals Temp: 98.1 F (36.7 C) Temp Source: Oral Pulse Rate: 72 Resp: 20 BP: (!) 157/79 Patient Position (if appropriate): Lying Oxygen Therapy SpO2: 97 % O2 Device: Room Air Pain: Pain Assessment Pain Scale: 0-10 Pain Score: 0-No pain   Therapy/Group: Individual Therapy  Curtis Sites 07/17/2018, 7:36 AM

## 2018-07-17 NOTE — Progress Notes (Addendum)
Occupational Therapy Session Note  Patient Details  Name: Dana Porter MRN: [id number] Date of Birth: 02/07/1934  Today's Date: 07/17/2018 OT Individual Time: 1100-1154 OT Individual Time Calculation (min): 54 min    Short Term Goals: Week 1:  OT Short Term Goal 1 (Week 1): n/a d/t ELOS  Skilled Therapeutic Interventions/Progress Updates:    Treatment session with focus on functional mobility and awareness of impairments.  Pt received upright in bed.  Ambulated to bathroom with CGA without AD and min cues for sequencing and awareness as pt stumbling on threshold in to bathroom.  CGA for ambulation due to internally and externally distracted and verbose requiring cues to attend to task.  Engaged in Pill Box assessment with pt making mistakes by placing 2 pills in same spot when instructions stated "one pill twice a day".  Required 6:05 to complete pill box assessment.  Engaged in sequencing tasks and organization tasks with focus on sustained attention, pt often reading part of instructions and giving answer before reading or listening to complete instruction.  Pt continues to require cues to sustain attention to both seated structured and more dynamic tasks.  CGA when ambulating back to room.  Pt left upright in bed with lunch tray set up and husband present.  Therapy Documentation Precautions:  Precautions Precautions: Fall Precaution Comments: jp drain from R pariental/s/p crani Restrictions Weight Bearing Restrictions: No Pain: Pain Assessment Pain Scale: 0-10 Pain Score: 0-No pain   Therapy/Group: Individual Therapy  Rosalio Loud 07/17/2018, 12:07 PM

## 2018-07-17 NOTE — Progress Notes (Signed)
[place] PHYSICAL MEDICINE & REHABILITATION PROGRESS NOTE   Subjective/Complaints:  Discussed CT head result         ROS: Positive for nausea no vomiting today, no chest pain or shortness of breath   Objective:   Ct Head Wo Contrast  Result Date: 07/17/2018 CLINICAL DATA:  Follow-up examination for subdural hemorrhage. EXAM: CT HEAD WITHOUT CONTRAST TECHNIQUE: Contiguous axial images were obtained from the base of the skull through the vertex without intravenous contrast. COMPARISON:  Prior CT from 07/13/2018. FINDINGS: Brain: Postoperative changes from prior right frontoparietal craniotomy for subdural evacuation. A subdural drain has been removed since previous exam. Scattered foci of residual pneumocephalus, decreased from previous. Residual right convexity extra-axial collection measures relatively similar at 10 mm in maximal diameter. Persistent mild mass effect on the subjacent right cerebral hemisphere with trace 3 mm right-to-left midline shift, relatively similar. No hydrocephalus or ventricular trapping. Basilar cisterns remain patent. Remainder the brain is stable in appearance. No other new acute intracranial abnormality. Vascular: No hyperdense vessel. Skull: Postoperative changes from prior right craniotomy. Overlying soft tissue swelling with emphysema. Skin staples remain in place. Sinuses/Orbits: Globes and orbital soft tissues demonstrate no acute finding. Paranasal sinuses remain largely clear. No mastoid effusion. Other: None. IMPRESSION: 1. Postoperative changes from prior right craniotomy for subdural evacuation with interval removal of subdural drain and decreased pneumocephalus. Residual right convexity extra-axial collection similar in size measuring up to 10 mm in maximal thickness. Associated mass effect with trace right-to-left midline shift relatively stable. 2. No other new acute intracranial abnormality. Electronically Signed   By: Rise MuBenjamin  McClintock M.D.   On:  07/17/2018 04:54   Recent Labs    07/15/18 0905  WBC 9.7  HGB 11.1*  HCT 34.2*  PLT 300   Recent Labs    07/15/18 0905  NA 134*  K 3.5  CL 98  CO2 26  GLUCOSE 151*  BUN <5*  CREATININE 0.67  CALCIUM 8.7*    Intake/Output Summary (Last 24 hours) at 07/17/2018 0729 Last data filed at 07/17/2018 [id number] Gross per 24 hour  Intake 940 ml  Output -  Net 940 ml     Physical Exam: Vital Signs Blood pressure (!) 157/79, pulse 72, temperature 98.1 F (36.7 C), temperature source Oral, resp. rate 20, height 5\' 1"  (1.549 m), weight 76.8 kg, SpO2 97 %.  Head- s/p Right craniotomy fronto-parietal General: No acute distress Mood and affect are appropriate Heart: Regular rate and rhythm no rubs murmurs or extra sounds Lungs: Clear to auscultation, breathing unlabored, no rales or wheezes Abdomen: Positive bowel sounds, soft nontender to palpation, nondistended Extremities: No clubbing, cyanosis, or edema Skin: No evidence of breakdown, no evidence of rash Neurologic: Cranial nerves II through XII intact, motor strength is 5/5 in bilateral deltoid, bicep, tricep, grip, hip flexor, knee extensors, ankle dorsiflexor and plantar flexor Sensory exam normal sensation to light touch and proprioception in bilateral upper and lower extremities Cerebellar exam normal finger to nose to finger as well as heel to shin in bilateral upper and lower extremities Musculoskeletal: Full range of motion in all 4 extremities. No joint swelling   Assessment/Plan: 1. 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  Physiatrist is providing close team supervision and 24 hour management of active medical problems listed below.  Physiatrist and rehab team continue to assess barriers to discharge/monitor patient progress toward functional and medical goals  Care Tool:  Bathing    Body parts  bathed by patient: Right arm, Left arm, Chest, Abdomen, Front perineal area, Buttocks, Right upper leg, Left upper leg, Right lower leg, Left lower leg, Face         Bathing assist Assist Level: Contact Guard/Touching assist     Upper Body Dressing/Undressing Upper body dressing   What is the patient wearing?: Pull over shirt    Upper body assist Assist Level: Set up assist    Lower Body Dressing/Undressing Lower body dressing      What is the patient wearing?: Underwear/pull up, Pants     Lower body assist Assist for lower body dressing: Contact Guard/Touching assist     Toileting Toileting    Toileting assist Assist for toileting: Contact Guard/Touching assist     Transfers Chair/bed transfer  Transfers assist     Chair/bed transfer assist level: Contact Guard/Touching assist     Locomotion Ambulation   Ambulation assist      Assist level: Contact Guard/Touching assist Assistive device: Other (comment)(none) Max distance: 6875'   Walk 10 feet activity   Assist     Assist level: Contact Guard/Touching assist Assistive device: Other (comment)(none)   Walk 50 feet activity   Assist    Assist level: Contact Guard/Touching assist Assistive device: Other (comment)(none)    Walk 150 feet activity   Assist Walk 150 feet activity did not occur: Safety/medical concerns         Walk 10 feet on uneven surface  activity   Assist Walk 10 feet on uneven surfaces activity did not occur: Safety/medical concerns         Wheelchair     Assist Will patient use wheelchair at discharge?: No   Wheelchair activity did not occur: N/A         Wheelchair 50 feet with 2 turns activity    Assist    Wheelchair 50 feet with 2 turns activity did not occur: N/A       Wheelchair 150 feet activity     Assist Wheelchair 150 feet activity did not occur: N/A          Medical Problem List and Plan: 1.Functional  deficitssecondary to SDH--repeat CT for 1/6 per NS. -CIR PT OT speech eval's HCT stable 2. DVT Prophylaxis/Anticoagulation: Mechanical:Sequential compression devices, below kneeBilateral lower extremities 3.Headaches/Pain Management:Tylenol and then tramadol as first line for headache.  -Willusehydrocodoneonly for sever pain due to concern over nausea.  -Discontinue IV morphine.  -May need Topamax for ongoing HA. 4. Mood:LCSW to follow for evaluation and support. 5. Neuropsych: This patientiscapable of making decisions on herown behalf. 6. Skin/Wound Care:Monitor scalp for healing. Maintain adequate nutritional and hydration status.Should be able to remove staples later this week 7. Fluids/Electrolytes/Nutrition:Monitor I/O. Check lytes in am. D/c IVF and encourage fluid intake.  Intake improved today 480 mL in the morning 8. HTN: Continue Monitor qid with SBP goals <160. Will add prn clonidine in place of labetalol.  Vitals:   07/16/18 1944 07/17/18 0408  BP: (!) 150/68 (!) 157/79  Pulse: 76 72  Resp: 19 20  Temp: 98.2 F (36.8 C) 98.1 F (36.7 C)  SpO2: 95% 97%  mildly elevated systolic 1/6, monitor was on ACE-I at home 9. Nausea/Vomiting: Continues to be an issue--question acute on chronic.Worse recently with hydrocodone.Had negative swallow study few months ago. Continue PPI. Question due to constipation. Administer two dose Miralax today. Compazine prn.  Recommend GI eval as outpatient, seated in wheelchair for  all meals 10. Hypokalemia: Recheck labs in am.  Resolved potassium 3.5 on 07/15/2018   LOS: 3 days A FACE TO FACE EVALUATION WAS PERFORMED  Erick Colace 07/17/2018, 7:29 AM

## 2018-07-17 NOTE — Discharge Summary (Signed)
Physician Discharge Summary  Patient ID: Dana Porter MRN: [id number] DOB/AGE: 17-Jun-1934 85 y.o.  Admit date: 07/10/2018 Discharge date: 07/17/2018  Admission Diagnoses: Chronic subdural hematoma, left hemiparesis  Discharge Diagnoses: Chronic/subacute subdural hematoma, left hemiparesis Active Problems:   Status post surgery   Subdural hematoma [facility])   Discharged Condition: Improved  Hospital Course: Patient was admitted, taken to surgery for craniotomy for evacuation of subdural hematoma.  Postoperatively her left hemiparesis had substantially improved.  CT was repeated several days after surgery, and showed markedly decreased mass-effect and shift, and the subdural drain was removed.  PT and OT were consulted and worked patient through the postoperative period.  PM&R was consulted regarding CIR, which they felt would benefit the patient, and the patient is now transferred to the [facility] inpatient rehabilitation unit.  Consults: PT, OT, PM&R.  Discharge Exam: Blood pressure (!) 167/92, pulse 72, temperature 98.3 F (36.8 C), temperature source Oral, resp. rate 15, SpO2 96 %.  [facility]   Allergies as of 07/14/2018      Reactions   Dyazide [hydrochlorothiazide W-triamterene] Other (See Comments)   Raised body temp      Medication List    ASK your doctor about these medications   aspirin 81 MG chewable tablet Chew 162 mg by mouth once as needed (for arm numbness).   CALCIUM 500 PO Take 500 mg by mouth daily.   Fish Oil 1000 MG Caps Take 1,000 mg by mouth daily after lunch.   Garlic 200 MG Tabs Take 400 mg by mouth daily with lunch.   levothyroxine 50 MCG tablet Commonly known as:  SYNTHROID, LEVOTHROID Take 50 mcg by mouth daily before breakfast.   Magnesium 250 MG Tabs Take 250 mg by mouth at bedtime. Ask about: Which instructions should I use?   multivitamin with minerals tablet Take 1 tablet by  mouth daily.   quinapril 10 MG tablet Commonly known as:  ACCUPRIL Take 10 mg by mouth at bedtime.   simvastatin 20 MG tablet Commonly known as:  ZOCOR Take 20 mg by mouth daily.   Vitamin D-3 25 MCG (1000 UT) Caps Take 2,000 Units by mouth daily with lunch.        Signed: Hewitt Shorts, MD 07/17/2018, 11:52 AM

## 2018-07-17 NOTE — Progress Notes (Signed)
Per nursing, patient was given "Data Collection Information Summary for Patients in Inpatient Rehabilitation Facilities with attached Privacy Act Statement Health Care Records" upon admission.    Patient information reviewed and entered into [facility] System by Becky Barclay Lennox, PPS coordinator. Information including medical coding, function ability, and quality indicators will be reviewed and updated through discharge.   

## 2018-07-17 NOTE — Progress Notes (Signed)
Physical Therapy Session Note  Patient Details  Name: Dana Porter MRN: [id number] Date of Birth: 06/19/1934  Today's Date: 07/17/2018 PT Individual Time: 9678-9381 PT Individual Time Calculation (min): 60 min   Short Term Goals: Week 1:  PT Short Term Goal 1 (Week 1): =LTGs due to ELOS  Skilled Therapeutic Interventions/Progress Updates:  Pt received in bed & agreeable to tx. Pt reports no current pain but recent hx of HA but relieved with tylenol. Pt transfers supine<>sitting with hospital bed with supervision and hospital bed features. Pt ambulates room>gym without AD and CGA. Pt completed Berg Balance Test & scored 40/56; educated pt on interpretation of score & current fall risk.  Patient demonstrates increased fall risk as noted by score of 40/56 on Berg Balance Scale.  (<36= high risk for falls, close to 100%; 37-45 significant >80%; 46-51 moderate >50%; 52-55 lower >25%). Provided pt with SPC and pt continues to demonstrate decreased balance requiring CGA assist but can ambulate long distances (dayroom>ortho gym). Provided pt with rollator and educated pt throughout session on use/management of rollator brakes. Pt ambulates with rollator with CGA<>supervision. Pt engaged in standing kick ball with min assist for balance and task focusing on dynamic standing balance & weight shifting. At end of session pt left in bed with alarm set & needs in reach.   Pt is extremely internally distracted throughout session with therapist educating her on need to focus on functional tasks at hand but pt remains distracted throughout session. BP assessed during session, see below.    Therapy Documentation Precautions:  Precautions Precautions: Fall Precaution Comments: jp drain from R pariental/s/p crani Restrictions Weight Bearing Restrictions: No  Vital Signs: Therapy Vitals  After walking dayroom>ortho gym with SPC:  Pulse Rate: 86 BP: (!) 147/99 Patient Position (if appropriate):  Sitting  At end of session: HR = 87 bpm BP = 151/81 mmHg, LUE, sitting   Balance: Balance Balance Assessed: Yes Standardized Balance Assessment Standardized Balance Assessment: Berg Balance Test Berg Balance Test Sit to Stand: Able to stand without using hands and stabilize independently Standing Unsupported: Able to stand safely 2 minutes Sitting with Back Unsupported but Feet Supported on Floor or Stool: Able to sit safely and securely 2 minutes Stand to Sit: Sits safely with minimal use of hands Transfers: Able to transfer safely, minor use of hands Standing Unsupported with Eyes Closed: Able to stand 10 seconds with supervision Standing Ubsupported with Feet Together: Able to place feet together independently but unable to hold for 30 seconds From Standing, Reach Forward with Outstretched Arm: Can reach forward >12 cm safely (5") From Standing Position, Pick up Object from Floor: Able to pick up shoe, needs supervision From Standing Position, Turn to Look Behind Over each Shoulder: Looks behind one side only/other side shows less weight shift Turn 360 Degrees: Needs close supervision or verbal cueing Standing Unsupported, Alternately Place Feet on Step/Stool: Able to complete 4 steps without aid or supervision(8 steps with supervision) Standing Unsupported, One Foot in Front: Able to take small step independently and hold 30 seconds Standing on One Leg: Tries to lift leg/unable to hold 3 seconds but remains standing independently Total Score: 40    Therapy/Group: Individual Therapy  Sandi Mariscal 07/17/2018, 3:49 PM

## 2018-07-18 ENCOUNTER — Inpatient Hospital Stay (HOSPITAL_COMMUNITY): Payer: Medicare Other

## 2018-07-18 ENCOUNTER — Inpatient Hospital Stay (HOSPITAL_COMMUNITY): Payer: Medicare Other | Admitting: Physical Therapy

## 2018-07-18 ENCOUNTER — Inpatient Hospital Stay (HOSPITAL_COMMUNITY): Payer: Medicare Other | Admitting: Occupational Therapy

## 2018-07-18 NOTE — Progress Notes (Signed)
Dana Porter PHYSICAL MEDICINE & REHABILITATION PROGRESS NOTE   Subjective/Complaints:  Bowels are moving no new complaints            ROS: Positive for nausea no vomiting today, no chest pain or shortness of breath   Objective:   Ct Head Wo Contrast  Result Date: 07/17/2018 CLINICAL DATA:  Follow-up examination for subdural hemorrhage. EXAM: CT HEAD WITHOUT CONTRAST TECHNIQUE: Contiguous axial images were obtained from the base of the skull through the vertex without intravenous contrast. COMPARISON:  Prior CT from 07/13/2018. FINDINGS: Brain: Postoperative changes from prior right frontoparietal craniotomy for subdural evacuation. A subdural drain has been removed since previous exam. Scattered foci of residual pneumocephalus, decreased from previous. Residual right convexity extra-axial collection measures relatively similar at 10 mm in maximal diameter. Persistent mild mass effect on the subjacent right cerebral hemisphere with trace 3 mm right-to-left midline shift, relatively similar. No hydrocephalus or ventricular trapping. Basilar cisterns remain patent. Remainder the brain is stable in appearance. No other new acute intracranial abnormality. Vascular: No hyperdense vessel. Skull: Postoperative changes from prior right craniotomy. Overlying soft tissue swelling with emphysema. Skin staples remain in place. Sinuses/Orbits: Globes and orbital soft tissues demonstrate no acute finding. Paranasal sinuses remain largely clear. No mastoid effusion. Other: None. IMPRESSION: 1. Postoperative changes from prior right craniotomy for subdural evacuation with interval removal of subdural drain and decreased pneumocephalus. Residual right convexity extra-axial collection similar in size measuring up to 10 mm in maximal thickness. Associated mass effect with trace right-to-left midline shift relatively stable. 2. No other new acute intracranial abnormality. Electronically Signed   By: Rise MuBenjamin  McClintock M.D.    On: 07/17/2018 04:54   Recent Labs    07/15/18 0905  WBC 9.7  HGB 11.1*  HCT 34.2*  PLT 300   Recent Labs    07/15/18 0905  NA 134*  K 3.5  CL 98  CO2 26  GLUCOSE 151*  BUN <5*  CREATININE 0.67  CALCIUM 8.7*    Intake/Output Summary (Last 24 hours) at 07/18/2018 0735 Last data filed at 07/17/2018 2200 Gross per 24 hour  Intake 20 ml  Output -  Net 20 ml     Physical Exam: Vital Signs Blood pressure 137/66, pulse 64, temperature 98 F (36.7 C), temperature source Oral, resp. rate 18, height 5\' 1"  (1.549 m), weight 76.8 kg, SpO2 97 %.  Head- s/p Right craniotomy fronto-parietal General: No acute distress Mood and affect are appropriate Heart: Regular rate and rhythm no rubs murmurs or extra sounds Lungs: Clear to auscultation, breathing unlabored, no rales or wheezes Abdomen: Positive bowel sounds, soft nontender to palpation, nondistended Extremities: No clubbing, cyanosis, or edema Skin: No evidence of breakdown, no evidence of rash Neurologic: Cranial nerves II through XII intact, motor strength is 5/5 in bilateral deltoid, bicep, tricep, grip, hip flexor, knee extensors, ankle dorsiflexor and plantar flexor Sensory exam normal sensation to light touch and proprioception in bilateral upper and lower extremities Cerebellar exam normal finger to nose to finger as well as heel to shin in bilateral upper and lower extremities Musculoskeletal: Full range of motion in all 4 extremities. No joint swelling   Assessment/Plan: 1. 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  Physiatrist is providing close team supervision and 24 hour management of active medical problems listed below.  Physiatrist and rehab team continue to assess barriers to discharge/monitor patient progress toward functional  and medical goals  Care Tool:  Bathing    Body parts  bathed by patient: Right arm, Left arm, Chest, Abdomen, Front perineal area, Buttocks, Right upper leg, Left upper leg, Right lower leg, Left lower leg, Face         Bathing assist Assist Level: Contact Guard/Touching assist     Upper Body Dressing/Undressing Upper body dressing   What is the patient wearing?: Pull over shirt    Upper body assist Assist Level: Set up assist    Lower Body Dressing/Undressing Lower body dressing      What is the patient wearing?: Underwear/pull up, Pants     Lower body assist Assist for lower body dressing: Supervision/Verbal cueing     Toileting Toileting    Toileting assist Assist for toileting: Supervision/Verbal cueing     Transfers Chair/bed transfer  Transfers assist     Chair/bed transfer assist level: Contact Guard/Touching assist     Locomotion Ambulation   Ambulation assist      Assist level: Contact Guard/Touching assist Assistive device: Walker-rolling(rollator) Max distance: 150 ft    Walk 10 feet activity   Assist     Assist level: Contact Guard/Touching assist Assistive device: Walker-rolling(rollator)   Walk 50 feet activity   Assist    Assist level: Contact Guard/Touching assist Assistive device: Walker-rolling(rollator)    Walk 150 feet activity   Assist Walk 150 feet activity did not occur: Safety/medical concerns  Assist level: Contact Guard/Touching assist Assistive device: Walker-rolling(rollator)    Walk 10 feet on uneven surface  activity   Assist Walk 10 feet on uneven surfaces activity did not occur: Safety/medical concerns         Wheelchair     Assist Will patient use wheelchair at discharge?: No   Wheelchair activity did not occur: N/A         Wheelchair 50 feet with 2 turns activity    Assist    Wheelchair 50 feet with 2 turns activity did not occur: N/A       Wheelchair 150 feet activity     Assist Wheelchair 150 feet activity did not  occur: N/A          Medical Problem List and Plan: 1.Functional deficitssecondary to SDH--repeat CT for 1/6 per NS. -CIR PT OT speech eval's HCT stable, team conf in am 2. DVT Prophylaxis/Anticoagulation: Mechanical:Sequential compression devices, below kneeBilateral lower extremities 3.Headaches/Pain Management:Tylenol and then tramadol as first line for headache.  -Willusehydrocodoneonly for sever pain due to concern over nausea.  -Discontinue IV morphine.  -May need Topamax for ongoing HA. 4. Mood:LCSW to follow for evaluation and support. 5. Neuropsych: This patientiscapable of making decisions on herown behalf. 6. Skin/Wound Care:Monitor scalp for healing. Maintain adequate nutritional and hydration status.Should be able to remove staples later this week 7. Fluids/Electrolytes/Nutrition:Monitor I/O. Check lytes in am. D/c IVF and encourage fluid intake.  Intake improved today 940 mL on 1/6 8. HTN: Continue Monitor qid with SBP goals <160. Will add prn clonidine in place of labetalol.  Vitals:   07/17/18 1935 07/18/18 0420  BP: (!) 158/64 137/66  Pulse: 72 64  Resp: 16 18  Temp: 98 F (36.7 C) 98 F (36.7 C)  SpO2: 97% 97%  mildly elevated systolic 1/6, monitor was on ACE-I at home, started on lisinopril 9. Nausea/Vomiting: Continues to be an issue--question acute on chronic.Worse recently with hydrocodone.Had negative swallow study few months ago. Continue PPI. Question due to constipation. Administer two dose Miralax today. Compazine prn.  Recommend GI eval as outpatient, seated in wheelchair for all meals 10. Hypokalemia: Recheck labs in am.  Resolved potassium 3.5 on 07/15/2018   LOS: 4 days A FACE TO FACE EVALUATION WAS PERFORMED  Erick Colacendrew E Lynora Dymond 07/18/2018, 7:35 AM

## 2018-07-18 NOTE — IPOC Note (Signed)
Overall Plan of Care [facility](IPOC) Patient Details Name: Dana Porter MRN: [id number] DOB: 12/02/1933  Admitting Diagnosis: <principal problem not specified>  Hospital Problems: Active Problems:   SDH (subdural hematoma) (HCC)     Functional Problem List: Nursing Motor, Pain, Bowel, Safety, Endurance, Skin Integrity, Medication Management  PT Balance, Behavior, Endurance, Motor, Safety  OT Balance, Cognition, Endurance, Motor, Nutrition, Perception, Safety, Vision  SLP Cognition  TR         Basic ADL's: OT Grooming, Bathing, Dressing, Toileting     Advanced  ADL's: OT       Transfers: PT Bed to Chair, Bed Mobility, Car, Furniture, Civil Service fast [place]  OT Toilet, Research scientist (life sciences)Tub/Shower     Locomotion: PT Stairs, Ambulation     Additional Impairments: OT    SLP Social Cognition   Problem Solving, Memory, Attention, Awareness  TR      Anticipated Outcomes Item Anticipated Outcome  Self Feeding MOD I  Swallowing      Basic self-care  MOD I   Toileting   MOD I   Bathroom Transfers MOD I  Bowel/Bladder  Min assist  Transfers  supervision  Locomotion  supervision household gait w/ LRAD  Communication     Cognition  Supervision  Pain  < 3  Safety/Judgment  Supervision   Therapy Plan: PT Intensity: Minimum of 1-2 x/day ,45 to 90 minutes PT Frequency: 5 out of 7 days PT Duration Estimated Length of Stay: 5-7 days OT Intensity: Minimum of 1-2 x/day, 45 to 90 minutes OT Frequency: 5 out of 7 days OT Duration/Estimated Length of Stay: 5-7 SLP Intensity: Minumum of 1-2 x/day, 30 to 90 minutes SLP Frequency: 3 to 5 out of 7 days SLP Duration/Estimated Length of Stay: 5 to 7 days    Team Interventions: Nursing Interventions Patient/Family Education, Pain Management, Bowel Management, Disease Management/Prevention, Medication Management, Skin Care/Wound Management  PT interventions Ambulation/gait training, Disease management/prevention, Pain management, Stair training,  Visual/perceptual remediation/compensation, Wheelchair propulsion/positioning, Therapeutic Activities, Patient/family education, Cognitive remediation/compensation, Warden/rangerBalance/vestibular training, DME/adaptive equipment instruction, Psychosocial support, Therapeutic Exercise, Community reintegration, Functional mobility training, Skin care/wound management, UE/LE Strength taining/ROM, UE/LE Coordination activities, Splinting/orthotics, Neuromuscular re-education, Discharge planning  OT Interventions Balance/vestibular training, Functional electrical stimulation, Neuromuscular re-education, Patient/family education, Self Care/advanced ADL retraining, Splinting/orthotics, Therapeutic Exercise, UE/LE Coordination activities, Cognitive remediation/compensation, Discharge planning, DME/adaptive equipment instruction, Functional mobility training, Pain management, Community reintegration, Psychosocial support, Skin care/wound managment, Therapeutic Activities, UE/LE Strength taining/ROM  SLP Interventions Cognitive remediation/compensation, Patient/family education, Functional tasks, Medication managment  TR Interventions    SW/CM Interventions Discharge Planning, Psychosocial Support, Patient/Family Education   Barriers to Discharge MD  Medical stability and Wound care  Nursing      PT Behavior restless, mild impulsivity, verbose and moves quickly w/ decreased safety awareness  OT      SLP      SW       Team Discharge Planning: Destination: PT-Home(daughter's house w/ daughter and spouse) ,OT- Home , SLP-Home Projected Follow-up: PT-Outpatient PT, OT-  Home health OT, Outpatient OT, SLP-24 hour supervision/assistance Projected Equipment Needs: PT-To be determined, OT- 3 in 1 bedside comode, SLP-None recommended by SLP Equipment Details: PT- , OT-  Patient/family involved in discharge planning: PT- Patient,  OT-Patient, Family member/caregiver, SLP-Patient  MD ELOS: 7-10d Medical Rehab Prognosis:   Good Assessment:  85 year old female with history of HTN, daily falls X 3 days with onset of left sided weakness on 07/10/18. She was found to have large acute on subacute right cerebral SDH with  effacement of right lateral ventricle and 11 mm right to left midline shift. She was taken to OR emergently for right fronto-parietal craniotomy for evacuation of SDH by Dr. Jule Ser. Follow up CT head revealed decrease in size of SDH with marked improvement in midline shift and mild pneumocephalus. She continues to have issues with HA, bouts of vomiting, left sided weakness and poor safety awareness with distractibility   Now requiring 24/7 Rehab RN,MD, as well as CIR level PT, OT and SLP.  Treatment team will focus on ADLs and mobility with goals set at Mod I See Team Conference Notes for weekly updates to the plan of care

## 2018-07-18 NOTE — Progress Notes (Signed)
Occupational Therapy Session Note  Patient Details  Name: Dana Porter MRN: [id number] Date of Birth: [birth year]  Today's Date: 07/18/2018 OT Individual Time: 0800-0900 OT Individual Time Calculation (min): 60 min    Short Term Goals: Week 1:  OT Short Term Goal 1 (Week 1): n/a d/t ELOS  Skilled Therapeutic Interventions/Progress Updates:    Treatment session with focus on ADL retraining and functional mobility with Rollator.  Pt received seated EOB and reports desire to shower.  Pt ambulated around room with Rollator with mod cues for sequencing and safety with appropriate use of Rollator and locking brakes.  Pt able to recall need to lock brakes but requiring cues for technique.  Supervision with all mobility, bathroom transfers, and self-care tasks.  Ambulated to ADL apt where pt completed tub/shower transfers by stepping over tub ledge with close supervision and use of grab bars.  Discussed recommendation for seat in shower (tub bench vs shower seat), plan to further discuss with family when they are present.  Pt continues to be quite verbose throughout session, requiring cues to attend to task.  Pt even able to identify need to talk less but unable to maintain.  Returned to room ambulating with Rollator with supervision.  Left upright in w/c with seat belt alarm on and all needs in reach.  Therapy Documentation Precautions:  Precautions Precautions: Fall Precaution Comments: jp drain from R pariental/s/p crani Restrictions Weight Bearing Restrictions: No Pain: Pain Assessment Pain Scale: 0-10 Pain Score: 2  Pain Type: Acute pain Pain Location: Head Pain Orientation: Right Pain Descriptors / Indicators: Headache Pain Frequency: Constant Pain Onset: On-going   Therapy/Group: Individual Therapy  Johnica Armwood 07/18/2018, 9:00 AM

## 2018-07-18 NOTE — Progress Notes (Signed)
Physical Therapy Session Note  Patient Details  Name: Dana Porter MRN: [id number] Date of Birth: 06-02-1934  Today's Date: 07/18/2018 PT Individual Time: 8828-0034 PT Individual Time Calculation (min): 31 min   Short Term Goals: Week 1:  PT Short Term Goal 1 (Week 1): =LTGs due to ELOS  Skilled Therapeutic Interventions/Progress Updates:  Pt received sitting on EOB & agreeable to tx. Pt dons tennis shoes with set up assist. Pt ambulates room<>gym with rollator & supervision with ongoing cuing for brake management and proper sequencing/hand placement for sit<>stand transfers.  Pt reports her daughter has a L rail at steps at front door & R rail at steps at garage, also stating she enters at the garage. Pt negotiates 8 steps + 8 steps with R rail with CGA and max cuing for compensatory pattern and for hand placement on rail. Pt negotiates obstacle course (weaving between cones, negotiating uneven surface) with rollator with cuing for transitioning rollator up/down mat and cuing for sustained attention to task. At end of session pt left in bed with alarm set & husband present in room, call bell in reach.  Educated husband on pt's decreased recall of safe brake management with rollator with him reporting he has used a rollator in the past - educated him on need to cue pt for safe use of device.  Therapy Documentation Precautions:  Precautions Precautions: Fall Precaution Comments: jp drain from R pariental/s/p crani Restrictions Weight Bearing Restrictions: No  Pain: Pt reports pain in R jaw with eating some food and during session reports B aching with rest breaks provided PRN. At end of session pt reports 4/10 HA & pain meds requested.   Therapy/Group: Individual Therapy  Sandi Mariscal 07/18/2018, 2:14 PM

## 2018-07-18 NOTE — Progress Notes (Signed)
Speech Language Pathology Daily Session Note  Patient Details  Name: Ezmariah Bouyer MRN: [id number] Date of Birth: 1934-05-18  Today's Date: 07/18/2018 SLP Individual Time: 0086-7619 SLP Individual Time Calculation (min): 60 min  Short Term Goals: Week 1: SLP Short Term Goal 1 (Week 1): Pt will demonstrate selective attention in moderately distracting environment for ~ 30 minutes with supervision cues.  SLP Short Term Goal 2 (Week 1): Pt will complete complex problem solving tasks with supervision cues.  SLP Short Term Goal 3 (Week 1): Pt will recall list of medication and their purpose with supervision cues.  SLP Short Term Goal 4 (Week 1): Pt will demonstrate anticipatory awareness by identify 3 safe and 3 unsafe activities she can perform within home environment with supervision cues.  SLP Short Term Goal 5 (Week 1): Pt will self-monitor off-topic distracting comments in 5 out of 10 opportunities with Min A cues.   Skilled Therapeutic Interventions:Skilled ST services focused on cognitive skills. SLP facilitated complex problem solving skills utilizing medication management of current medication, pt required min A verbal cues, likely due to conflicts with prior schedule of medication and internal distractions impacting working memory. Pt demonstrated tangential speech requiring mod A verbal cues to remain on topic, however pt supports verbose behavior prior to acute deficit. SLP facilitated recall of medication name and function, pt required use of external aid. SLP facilitated listing 3 safe verse unsafe activities upon discharge, pt required max A verbal cues. SLP will communicate with PT/OT to collaborate on safety awareness and safe/unsafe activities. Pt was left in room with call bell within reach and chair alarm set. SLP reccomends to continue skilled services.     Pain Pain Assessment Pain Scale: 0-10 Pain Score: 0-No pain Pain Type: Acute pain Pain Location: Head Pain Orientation:  Right Pain Descriptors / Indicators: Headache Pain Frequency: Constant Pain Onset: On-going  Therapy/Group: Individual Therapy  Shaquelle Hernon  [facility] 07/18/2018, 10:43 AM

## 2018-07-18 NOTE — Progress Notes (Addendum)
Physical Therapy Session Note  Patient Details  Name: Dana Porter MRN: [id number] Date of Birth: [birth year]  Today's Date: 07/18/2018 PT Individual Time: 1100-1159 PT Individual Time Calculation (min): 59 min  Short Term Goals: Week 1:  PT Short Term Goal 1 (Week 1): =LTGs due to ELOS  Skilled Therapeutic Interventions/Progress Updates:  Pt received in bed & agreeable to tx, but reporting feeling "exhausted". Pt transferred to EOB with supervision and dons/doffs shoes, sweater with set up assist. Gait training with rollator throughout unit with CGA<>supervision assist with ongoing educating regarding management of brakes. In gym, balance addressed with pt engaging in ball toss and retrieving objects from floor with narrow, staggered & tandem stance with min assist. Pt engaged in side stepping and retrograde gait without UE support and min assist in hallway. Pt completes floor transfer with supervision with education regarding need to get checked out by EMS if she hits her head during a fall. Pt reports she has a hot tub at home and will get on the floor to look for her pets under the bed with therapist educating her not to do either of these but unsure of pt's ability to retain education. Pt utilized kinetron in sitting then standing with BUE>1UE>no UE support with min assist and cuing for upright posture & technique with task focusing on dynamic balance, weight shifting, BLE strengthening, and sustained attention to task. At end of session pt left sitting in w/c in room with chair alarm donned & set up with meal tray. Pt continues to remain internally distracted throughout session, with decreased overall awareness.   BP = 153/79 mmHg, HR = 90 bpm while using kinetron.  Pain: Pt reports "ache" in lateral aspect of B thighs & rest breaks taken PRN. Pt also reports 2/10 HA with use of kinetron & BP assessed.     Therapy Documentation Precautions:  Precautions Precautions: Fall Precaution  Comments: jp drain from R pariental/s/p crani Restrictions Weight Bearing Restrictions: No    Therapy/Group: Individual Therapy  Sandi Mariscal 07/18/2018, 12:17 PM

## 2018-07-18 NOTE — Progress Notes (Signed)
Social Work Patient ID: Dana Porter, female   DOB: 1933-12-20, 85 y.o.   MRN: [id number] Gave husband daughter's FMLA papers back and daughter to take in to her employer. Aware team conference tomorrow.

## 2018-07-19 ENCOUNTER — Inpatient Hospital Stay (HOSPITAL_COMMUNITY): Payer: Medicare Other | Admitting: Physical Therapy

## 2018-07-19 ENCOUNTER — Inpatient Hospital Stay (HOSPITAL_COMMUNITY): Payer: Medicare Other | Admitting: Occupational Therapy

## 2018-07-19 ENCOUNTER — Inpatient Hospital Stay (HOSPITAL_COMMUNITY): Payer: Medicare Other | Admitting: Speech Pathology

## 2018-07-19 LAB — BASIC METABOLIC PANEL
Anion gap: 6 (ref 5–15)
BUN: 11 mg/dL (ref 8–23)
CO2: 28 mmol/L (ref 22–32)
Calcium: 8.9 mg/dL (ref 8.9–10.3)
Chloride: 101 mmol/L (ref 98–111)
Creatinine, Ser: 0.76 mg/dL (ref 0.44–1.00)
GFR calc Af Amer: >60 mL/min (ref >60)
GFR calc non Af Amer: >60 mL/min (ref >60)
Glucose, Bld: 100 mg/dL — ABNORMAL HIGH (ref 70–99)
Potassium: 4 mmol/L (ref 3.5–5.1)
Sodium: 135 mmol/L (ref 135–145)

## 2018-07-19 LAB — CBC
HCT: 36.1 % (ref 36.0–46.0)
HEMOGLOBIN: 11.6 g/dL — AB (ref 12.0–15.0)
MCH: 28 pg (ref 26.0–34.0)
MCHC: 32.1 g/dL (ref 30.0–36.0)
MCV: 87.2 fL (ref 80.0–100.0)
Platelets: 354 10*3/uL (ref 150–400)
RBC: 4.14 MIL/uL (ref 3.87–5.11)
RDW: 13.9 % (ref 11.5–15.5)
WBC: 10.5 10*3/uL (ref 4.0–10.5)
nRBC: 0 % (ref 0.0–0.2)

## 2018-07-19 MED ORDER — ACETAMINOPHEN 325 MG PO TABS: 325.0000 mg | ORAL_TABLET | ORAL | Status: AC | PRN

## 2018-07-19 NOTE — Patient Care Conference (Signed)
Inpatient RehabilitationTeam Conference and Plan of Care Update Date: 07/19/2018   Time: 10:30 AM    Patient Name: Dana Porter      Medical Record Number: [id number]  Date of Birth: 02/09/1934 Sex: Female         Room/Bed: 4W10C/4W10C-01 Payor Info: Payor: [employer] copy[employer] MEDICARE / Plan: [employer] MEDICARE / Product Type: *No Product type* /    Admitting Diagnosis: SDH 5PCRANI  Admit Date/Time:  07/14/2018  5:26 PM Admission Comments: No comment available   Primary Diagnosis:  <principal problem not specified> Principal Problem: <principal problem not specified>  Patient Active Problem List   Diagnosis Date Noted  . SDH (subdural hematoma) (HCC) 07/14/2018  . Subdural hematoma (HCC) 07/11/2018  . Status post surgery 07/10/2018    Expected Discharge Date: Expected Discharge Date: 07/21/18  Team Members Present: Physician leading conference: Dr. Claudette LawsAndrew Kirsteins Social Worker Present: Dossie DerBecky Amit Leece, LCSW Nurse Present: Other (comment)(Christal Bennett-LPN) PT Present: Aleda GranaVictoria Miller, PT OT Present: Rosalio LoudSarah Hoxie, OT SLP Present: Reuel DerbyHappi Overton, SLP PPS Coordinator present : Fae PippinMelissa Bowie     Current Status/Progress Goal Weekly Team Focus  Medical   Patient with nausea, GERD symptoms, overall improving, wound is looking good  Maintain medical stability, improve intake  Continue rehabilitation program   Bowel/Bladder   Continent of B/B, LBM 07/18/2018  Remain continent of B/B with normal bowel pattern  Assess toileting needs QS and prn   Swallow/Nutrition/ Hydration             ADL's   Supervision transfers and bathing at shower level and LB dressing, Mod I UB dressing and donning shoes.  Pt internally distracted, requiring cues to attend to task completion  Mod I overall, Supervision bathing and shower transfers  ADL retraining, dynamic standing balance, transfers, sustained attention, memory   Mobility   progressing to supervision with rollator, CGA for stair negotiation with 1  rail, very internally distracted with imapired awareness & recall  supervision overall with LRAD except mod I for bed mobility  balance, attention, cognition, NMR, transfers, gait, stair negotiation, pt/family education & d/c planning   Communication             Safety/Cognition/ Behavioral Observations  emerging supervision  supervision  complex problem solving, selective attention, memory   Pain   Tyelno for lower leg and knee pain   pt will be free from pain   Assess and medicate, evaluate request Tylenol   Skin   Surgical incion site s/p right cranio wit h staple inte and no sighs infection or draainage  pt free of infection and skin breakdown resolution of current issues  Assess QS/PRN       *See Care Plan and progress notes for long and short-term goals.     Barriers to Discharge  Current Status/Progress Possible Resolutions Date Resolved   Physician    Medical stability     Progressing towards goals  Continue current medication management and rehabilitation      Nursing                  PT  Behavior  impulsive, impaired awareness/cognition              OT                  SLP                SW                Discharge  Planning/Teaching Needs:  Home to daughter's home with husband and daughter Dana Porter FMLA-husband has been here daily and has observed in therapies.      Team Discussion:  Goals supervision level impulsive and moves quickly becomes internally distracted and needs to focus. Stomach better and nausea improved. Staples out tomorrow. Working balance issues and making good progress in her therapies. Will require 24 hr supervision-via husband and daughter  Revisions to Treatment Plan:  DC 1/10    Continued Need for Acute Rehabilitation Level of Care: The patient requires daily medical management by a physician with specialized training in physical medicine and rehabilitation for the following conditions: Daily direction of a multidisciplinary physical  rehabilitation program to ensure safe treatment while eliciting the highest outcome that is of practical value to the patient.: Yes Daily medical management of patient stability for increased activity during participation in an intensive rehabilitation regime.: Yes Daily analysis of laboratory values and/or radiology reports with any subsequent need for medication adjustment of medical intervention for : Blood pressure problems   I attest that I was present, lead the team conference, and concur with the assessment and plan of the team.   Dana Porter 07/19/2018, 12:00 PM

## 2018-07-19 NOTE — Plan of Care (Signed)
  Problem: Consults Goal: RH STROKE PATIENT EDUCATION Description See Patient Education module for education specifics  Outcome: Progressing   Problem: RH BOWEL ELIMINATION Goal: RH STG MANAGE BOWEL W/MEDICATION W/ASSISTANCE Description STG Manage Bowel with Medication with mod I Assistance.  Outcome: Progressing   Problem: RH SKIN INTEGRITY Goal: RH STG MAINTAIN SKIN INTEGRITY WITH ASSISTANCE Description STG Maintain Skin Integrity With min Assistance.  Outcome: Progressing   Problem: RH SAFETY Goal: RH STG ADHERE TO SAFETY PRECAUTIONS W/ASSISTANCE/DEVICE Description STG Adhere to Safety Precautions With supervision Assistance/Device.  Outcome: Progressing   Problem: RH PAIN MANAGEMENT Goal: RH STG PAIN MANAGED AT OR BELOW PT'S PAIN GOAL Description < 3  Outcome: Progressing

## 2018-07-19 NOTE — Progress Notes (Signed)
sPhysical Therapy Discharge Summary  Patient Details  Name: Dana Porter MRN: [id number] Date of Birth: [birth year]  Today's Date: 07/20/2018    Patient has met 10 of 10 long term goals due to improved activity tolerance, improved balance, improved postural control, increased strength, ability to compensate for deficits, functional use of  left upper extremity and left lower extremity, improved attention, improved awareness and improved coordination.  Patient to discharge at an ambulatory level supervision with rollator.   Patient's husband has been present for caregiver education but has not participated in any hands on training. Pt's husband appears to have good awareness regarding pt's need for increased safety & poor attention.   Reasons goals not met: N/A- all goals met   Recommendation:  Patient will benefit from ongoing skilled PT services in outpatient setting to continue to advance safe functional mobility, address ongoing impairments in LUE/LLE neuromuscular re-education, standing balance, safety & overall awareness, gait and stair negotiation, and minimize fall risk.  Equipment: rollator  Reasons for discharge: treatment goals met and discharge from hospital  Patient/family agrees with progress made and goals achieved: Yes  PT Discharge Precautions/Restrictions Precautions Precautions: Fall Restrictions Weight Bearing Restrictions: No   Vital Signs Therapy Vitals Pulse Rate: 82 Resp: 17 BP: (!) 152/83 Patient Position (if appropriate): Sitting Oxygen Therapy SpO2: 97 % O2 Device: Room Air   Pain Pain Assessment Pain Scale: 0-10 Pain Score: 0-No pain   Vision/Perception  Vision - Assessment Eye Alignment: Within Functional Limits Ocular Range of Motion: Within Functional Limits Alignment/Gaze Preference: Within Defined Limits Tracking/Visual Pursuits: Able to track stimulus in all quads without difficulty Saccades: Undershoots;Decreased speed of saccadic  movement Additional Comments: very easily distracted, requires frequent redirection  Perception Perception: Within Functional Limits Praxis Praxis: Intact   Cognition Overall Cognitive Status: Impaired/Different from baseline Arousal/Alertness: Awake/alert Orientation Level: Oriented X4 Attention: Selective Selective Attention: Impaired Memory: Impaired Memory Impairment: Decreased recall of new information Decreased Short Term Memory: Functional basic Awareness: Impaired Awareness Impairment: Anticipatory impairment Problem Solving: Impaired Behaviors: Restless;Impulsive(internally distracted, verbose) Safety/Judgment: Impaired  Sensation Sensation Light Touch: Appears Intact Coordination Gross Motor Movements are Fluid and Coordinated: Yes( B LE ) Fine Motor Movements are Fluid and Coordinated: Yes Heel Shin Test: WNL bilaterally   Motor  Motor Motor: Hemiplegia Motor - Skilled Clinical Observations: mild L hemi Motor - Discharge Observations: mild L hemi, generalized deconditioning   Mobility Bed Mobility Bed Mobility: Rolling Right;Rolling Left;Supine to Sit;Sit to Supine Rolling Right: Independent Rolling Left: Independent Supine to Sit: Independent Sit to Supine: Independent Transfers Transfers: Sit to Stand;Stand to Sit;Stand Pivot Transfers Sit to Stand: Independent with assistive device Stand to Sit: Supervision/Verbal cueing Stand Pivot Transfers: Supervision/Verbal cueing Stand Pivot Transfer Details: Verbal cues for precautions/safety Transfer (Assistive device): Rollator   Locomotion  Gait Ambulation: Yes Gait Assistance: Supervision/Verbal cueing Gait Distance (Feet): 250 Feet Assistive device: Rollator Gait Assistance Details: Verbal cues for precautions/safety Gait Gait: Yes Gait Pattern: Impaired Gait velocity: occasional cues for safety navigating dynamic obstacles in environment  Stairs / Additional Locomotion Stairs: Yes Stairs  Assistance: Supervision/Verbal cueing Stair Management Technique: One rail Right Number of Stairs: 12 Height of Stairs: 6 Ramp: Supervision/Verbal cueing Curb: Supervision/Verbal cueing Wheelchair Mobility Wheelchair Mobility: No   Trunk/Postural Assessment  Cervical Assessment Cervical Assessment: Within Functional Limits Thoracic Assessment Thoracic Assessment: Within Functional Limits Lumbar Assessment Lumbar Assessment: Within Functional Limits Postural Control Postural Control: Within Functional Limits   Balance Balance Balance Assessed: Yes Standardized Balance Assessment Standardized Balance Assessment:  Berg Balance Test Berg Balance Test Sit to Stand: Able to stand without using hands and stabilize independently Standing Unsupported: Able to stand safely 2 minutes Sitting with Back Unsupported but Feet Supported on Floor or Stool: Able to sit safely and securely 2 minutes Stand to Sit: Sits safely with minimal use of hands Transfers: Able to transfer safely, minor use of hands Standing Unsupported with Eyes Closed: Able to stand 10 seconds with supervision Standing Ubsupported with Feet Together: Able to place feet together independently and stand for 1 minute with supervision From Standing, Reach Forward with Outstretched Arm: Can reach forward >12 cm safely (5") From Standing Position, Pick up Object from Floor: Able to pick up shoe, needs supervision From Standing Position, Turn to Look Behind Over each Shoulder: Looks behind one side only/other side shows less weight shift Turn 360 Degrees: Needs close supervision or verbal cueing Standing Unsupported, Alternately Place Feet on Step/Stool: Able to stand independently and complete 8 steps >20 seconds Standing Unsupported, One Foot in Front: Able to plae foot ahead of the other independently and hold 30 seconds Standing on One Leg: Able to lift leg independently and hold equal to or more than 3 seconds Total Score:  44 Dynamic Sitting Balance Dynamic Sitting - Balance Support: No upper extremity supported;Feet supported;During functional activity Dynamic Sitting - Level of Assistance: 5: Stand by assistance Dynamic Standing Balance Dynamic Standing - Balance Support: During functional activity;No upper extremity supported Dynamic Standing - Level of Assistance: 5: Stand by assistance  Berg Balance Test = 44/56 on 07/20/2018  Extremity Assessment  RUE Assessment RUE Assessment: Within Functional Limits LUE Assessment LUE Assessment: Within Functional Limits General Strength Comments: generalized weakness RLE Assessment RLE Assessment: Within Functional Limits LLE Assessment LLE Assessment: Within Functional Limits   Lavone Nian, PT, DPT 07/19/2018, 12:55 PM  Deniece Ree PT, DPT, CBIS  Supplemental Physical Therapist [place]    Pager [phone] Acute Rehab Office [phone]

## 2018-07-19 NOTE — Progress Notes (Signed)
Social Work Patient ID: Dana Porter, female   DOB: 02-08-1934, 85 y.o.   MRN: [id number] Met with pt and husband to discuss team conference goals supervision with cues and her need to slow down. She realizes this and is trying to slow herself down but it is not in her nature. Daughter recieved her FMLA papers completed by PA. Discussed equipment and follow up therapy. Both want Home health due to not from here and husband feels he can do it on his own. Will make discharge arrangements for Friday.

## 2018-07-19 NOTE — Progress Notes (Signed)
Physical Therapy Session Note  Patient Details  Name: Dana Porter MRN: [id number] Date of Birth: Apr 05, 1934  Today's Date: 07/19/2018 PT Individual Time: 1349-1458 PT Individual Time Calculation (min): 69 min   Short Term Goals: Week 1:  PT Short Term Goal 1 (Week 1): =LTGs due to ELOS  Skilled Therapeutic Interventions/Progress Updates:  Pt received in w/c & agreeable to tx. Pt's husband present for education throughout session but did not participate in hands on training. Pt ambulates around unit with rollator and supervision with ongoing cuing for management of rollator brakes but some improvement noted today compared to yesterday in regards to recall. Educated pt & husband on recommendation of 24 hr supervision, f/u HHPT, rollator use, and home modifications to increase safety (remove/secure throw rugs, ensure pets do not get under pt's feet); also educated them that pt should not get into hot tub nor electively get on floor but if she experiences a fall should call EMS. Pt completes bed mobility in apartment with independence but pt reports she will put a chair by her bed to prevent rolling out (have educated pt/husband in the past on ability to install bed rails if they wish). Pt completes car transfer at sedan simulated height with supervision and cuing to sit and transfer BLE in/out vs stepping in/out. Pt negotiates 12 steps with R rail per home access with close supervision and MAX education & instructional cuing for compensatory pattern as pt is very internally distracted with poor attention to task at hand. Pt engaged in ladder activities, forward/backward and laterally stepping in/out of ladder with close supervision<>CGA. Pt engaged in trampoline ball toss while standing on foam with narrow BOS & (narrow) staggered stance with up to min assist with task focusing on balance. Pt performed 10x sit<>stand from w/c with supervision for BLE strengthening. At end of session pt left sitting in w/c  in room with chair alarm donned & call bell in reach, husband in room.   Pt continues to demonstrate poor attention to task, is very internally distracted, and with poor recall & overall safety awareness.   Therapy Documentation Precautions:  Precautions Precautions: Fall Precaution Comments: jp drain from R pariental/s/p crani Restrictions Weight Bearing Restrictions: No  Pain: Pt reports unrated pain in R jaw & soreness in BLE - rest breaks provided PRN & RN made aware.    Therapy/Group: Individual Therapy  Sandi Mariscal 07/19/2018, 3:53 PM

## 2018-07-19 NOTE — Progress Notes (Signed)
Occupational Therapy Session Note  Patient Details  Name: Dana Porter MRN: [id number] Date of Birth: 06-27-1934  Today's Date: 07/19/2018 OT Individual Time: 1104-1200 OT Individual Time Calculation (min): 56 min    Short Term Goals: Week 1:  OT Short Term Goal 1 (Week 1): n/a d/t ELOS  Skilled Therapeutic Interventions/Progress Updates:    Treatment session with focus on ADL retraining and safety with Rollator with functional mobility.  Pt received upright in w/c reporting desire to shower.  Pt ambulated to room shower with Rollator with min cues for safety with Rollator.  Pt demonstrating improved sequencing and recall when locking brakes throughout session.  Completed toilet transfer and shower transfer with supervision/cues.  Pt completed bathing at overall supervision level and completed dressing at Mod I level.  Engaged in grooming tasks in standing with focus on activity tolerance and endurance.  Pt continues to be quite verbose throughout session, often requiring cues to attend to task as she distracts herself.  Ambulated to ADL apt with Rollator with supervision.  Pt completed furniture transfers and stepping over tub ledge to 3 in 1 in shower with supervision.  Pt states that her daughter has requested 3 in1 for seat in shower, and after completion of transfer pt is safe to complete by stepping over ledge with supervision.  Returned to room and left upright in w/c with all needs in reach and seat belt alarm on.  Therapy Documentation Precautions:  Precautions Precautions: Fall Precaution Comments: jp drain from R pariental/s/p crani Restrictions Weight Bearing Restrictions: No Pain: Pain Assessment Pain Scale: 0-10 Pain Score: 5  Pain Type: Acute pain Pain Location: Head Pain Orientation: Anterior Pain Descriptors / Indicators: Headache Pain Frequency: Constant Pain Onset: Gradual Patients Stated Pain Goal: 0 Pain Intervention(s): Medication (See eMAR);Emotional  support   Therapy/Group: Individual Therapy  Rosalio Loud 07/19/2018, 12:23 PM

## 2018-07-19 NOTE — Progress Notes (Signed)
Speech Language Pathology Daily Session Note  Patient Details  Name: Carlaysia Redstone MRN: [id number] Date of Birth: February 08, 1934  Today's Date: 07/19/2018 SLP Individual Time: 0830-0930 SLP Individual Time Calculation (min): 60 min  Short Term Goals: Week 1: SLP Short Term Goal 1 (Week 1): Pt will demonstrate selective attention in moderately distracting environment for ~ 30 minutes with supervision cues.  SLP Short Term Goal 2 (Week 1): Pt will complete complex problem solving tasks with supervision cues.  SLP Short Term Goal 3 (Week 1): Pt will recall list of medication and their purpose with supervision cues.  SLP Short Term Goal 4 (Week 1): Pt will demonstrate anticipatory awareness by identify 3 safe and 3 unsafe activities she can perform within home environment with supervision cues.  SLP Short Term Goal 5 (Week 1): Pt will self-monitor off-topic distracting comments in 5 out of 10 opportunities with Min A cues.   Skilled Therapeutic Interventions:  Skilled treatment session focused on cognition goals. SLP facilitated session by providing supervision question cues to demonstrate anticipatory awareness. SLP provided hypothetical situations that pt might encounter within home environment. SLP provided supervision cues to provide appropriate answer. Pt also able to recall current medicines, when they should be taken and the purpose for each medicine. Pt left upright in bed with all needs within reach. Continue per current plan of care.      Pain Pain Assessment Pain Scale: 0-10 Pain Score: 0-No pain  Therapy/Group: Individual Therapy  Tonie Elsey 07/19/2018, 2:20 PM

## 2018-07-19 NOTE — Progress Notes (Signed)
[place] PHYSICAL MEDICINE & REHABILITATION PROGRESS NOTE   Subjective/Complaints:  Pt would like to take BP meds at noc, some belching but no epigastric pain, 2 good BMs yesterday                 ROS: Positive for nausea no vomiting today, no chest pain or shortness of breath   Objective:   No results found. Recent Labs    07/19/18 0528  WBC 10.5  HGB 11.6*  HCT 36.1  PLT 354   Recent Labs    07/19/18 0528  NA 135  K 4.0  CL 101  CO2 28  GLUCOSE 100*  BUN 11  CREATININE 0.76  CALCIUM 8.9    Intake/Output Summary (Last 24 hours) at 07/19/2018 0720 Last data filed at 07/18/2018 1300 Gross per 24 hour  Intake 480 ml  Output -  Net 480 ml     Physical Exam: Vital Signs Blood pressure (!) 146/77, pulse 70, temperature 97.7 F (36.5 C), temperature source Oral, resp. rate 18, height _0  (1.549 m), weight 76.8 kg, SpO2 96 %.  Head- s/p Right craniotomy fronto-parietal General: No acute distress Mood and affect are appropriate Heart: Regular rate and rhythm no rubs murmurs or extra sounds Lungs: Clear to auscultation, breathing unlabored, no rales or wheezes Abdomen: Positive bowel sounds, soft nontender to palpation, nondistended Extremities: No clubbing, cyanosis, or edema Skin: No evidence of breakdown, no evidence of rash Neurologic: Cranial nerves II through XII intact, motor strength is 5/5 in bilateral deltoid, bicep, tricep, grip, hip flexor, knee extensors, ankle dorsiflexor and plantar flexor Sensory exam normal sensation to light touch and proprioception in bilateral upper and lower extremities Cerebellar exam normal finger to nose to finger as well as heel to shin in bilateral upper and lower extremities Musculoskeletal: Full range of motion in all 4 extremities. No joint swelling   Assessment/Plan: 1. 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  Physiatrist is providing close team supervision and 24 hour management of active medical problems listed below.  Physiatrist and rehab team continue to assess barriers to discharge/monitor patient progress toward functional and medical goals  Care Tool:  Bathing    Body parts bathed by patient: Right arm, Left arm, Chest, Abdomen, Front perineal area, Buttocks, Right upper leg, Left upper leg, Right lower leg, Left lower leg, Face         Bathing assist Assist Level: Supervision/Verbal cueing     Upper Body Dressing/Undressing Upper body dressing   What is the patient wearing?: Pull over shirt    Upper body assist Assist Level: Independent with assistive device    Lower Body Dressing/Undressing Lower body dressing      What is the patient wearing?: Underwear/pull up, Pants     Lower body assist Assist for lower body dressing: Supervision/Verbal cueing     Toileting Toileting    Toileting assist Assist for toileting: Supervision/Verbal cueing     Transfers Chair/bed transfer  Transfers assist     Chair/bed transfer assist level: Supervision/Verbal cueing     Locomotion Ambulation   Ambulation assist      Assist level: Supervision/Verbal cueing Assistive device: (rollator) Max distance: 150 ft    Walk 10 feet activity   Assist     Assist level: Supervision/Verbal cueing Assistive device: (rollator)   Walk 50 feet activity   Assist    Assist level:  Supervision/Verbal cueing Assistive device: (rollator)    Walk 150 feet activity   Assist Walk 150 feet activity did not occur: Safety/medical concerns  Assist level: Supervision/Verbal cueing Assistive device: (rollator)    Walk 10 feet on uneven surface  activity   Assist Walk 10 feet on uneven surfaces activity did not occur: Safety/medical concerns   Assist level: Supervision/Verbal cueing Assistive device: (rollator)   Wheelchair     Assist Will patient  use wheelchair at discharge?: No   Wheelchair activity did not occur: N/A         Wheelchair 50 feet with 2 turns activity    Assist    Wheelchair 50 feet with 2 turns activity did not occur: N/A       Wheelchair 150 feet activity     Assist Wheelchair 150 feet activity did not occur: N/A          Medical Problem List and Plan: 1.Functional deficitssecondary to SDH--repeat CT for 1/6 per NS. -CIR PT OT speech eval's HCT stable, Team conference today please see physician documentation under team conference tab, met with team face-to-face to discuss problems,progress, and goals. Formulized individual treatment plan based on medical history, underlying problem and comorbidities. 2. DVT Prophylaxis/Anticoagulation: Mechanical:Sequential compression devices, below kneeBilateral lower extremities 3.Headaches/Pain Management:Tylenol and then tramadol as first line for headache.  Improved 4. Mood:LCSW to follow for evaluation and support. 5. Neuropsych: This patientiscapable of making decisions on herown behalf. 6. Skin/Wound Care:Monitor scalp for healing. Maintain adequate nutritional and hydration status.Should be able to remove staples in am 7. Fluids/Electrolytes/Nutrition:Monitor I/O. Check lytes in am. D/c IVF and encourage fluid intake.  Intake reduced  480 mL on 1/7 8. HTN: Continue Monitor qid with SBP goals <160. Will add prn clonidine in place of labetalol.  Vitals:   07/18/18 1949 07/19/18 0315  BP: 126/70 (!) 146/77  Pulse: 77 70  Resp: 20 18  Temp: 98.4 F (36.9 C) 97.7 F (36.5 C)  SpO2: 96% 96%  mildly elevated systolic 1/8, monitor was on ACE-I at home, started on lisinopril 9. Nausea/Vomiting: Continues to be an issue--question acute on chronic.Worse recently with hydrocodone.Had negative swallow study few months ago. Continue PPI. Question due to constipation. Administer two dose Miralax today. Compazine prn.   Recommend GI eval as outpatient, seated in wheelchair for all meals pt notes some improvement 10. Hypokalemia: Recheck labs in am.  Resolved potassium 3.5 on 07/15/2018   LOS: 5 days A FACE TO FACE EVALUATION WAS PERFORMED  Charlett Blake 07/19/2018, 7:20 AM

## 2018-07-20 ENCOUNTER — Inpatient Hospital Stay (HOSPITAL_COMMUNITY): Payer: Medicare Other | Admitting: Speech Pathology

## 2018-07-20 ENCOUNTER — Inpatient Hospital Stay (HOSPITAL_COMMUNITY): Payer: Medicare Other | Admitting: Occupational Therapy

## 2018-07-20 ENCOUNTER — Inpatient Hospital Stay (HOSPITAL_COMMUNITY): Payer: Medicare Other | Admitting: Physical Therapy

## 2018-07-20 MED ORDER — LISINOPRIL 20 MG PO TABS
20.0000 mg | ORAL_TABLET | Freq: Every day | ORAL | Status: DC
  Administered 2018-07-20 – 2018-07-21 (×2): 20 mg via ORAL
  Filled 2018-07-20 (×2): qty 1

## 2018-07-20 MED ORDER — LISINOPRIL 20 MG PO TABS: 20.0000 mg | ORAL_TABLET | Freq: Every day | ORAL | Status: DC

## 2018-07-20 NOTE — Progress Notes (Signed)
[place] PHYSICAL MEDICINE & REHABILITATION PROGRESS NOTE   Subjective/Complaints:  Worried about staple removal but no other c/os , discussed d/c date                 ROS: Positive for nausea no vomiting today, no chest pain or shortness of breath   Objective:   No results found. Recent Labs    07/19/18 0528  WBC 10.5  HGB 11.6*  HCT 36.1  PLT 354   Recent Labs    07/19/18 0528  NA 135  K 4.0  CL 101  CO2 28  GLUCOSE 100*  BUN 11  CREATININE 0.76  CALCIUM 8.9    Intake/Output Summary (Last 24 hours) at 07/20/2018 0736 Last data filed at 07/19/2018 1700 Gross per 24 hour  Intake 720 ml  Output -  Net 720 ml     Physical Exam: Vital Signs Blood pressure (!) 158/87, pulse 73, temperature 99.4 F (37.4 C), temperature source Oral, resp. rate 18, height 5\' 1"  (1.549 m), weight 76.8 kg, SpO2 100 %.  Head- s/p Right craniotomy fronto-parietal General: No acute distress Mood and affect are appropriate Heart: Regular rate and rhythm no rubs murmurs or extra sounds Lungs: Clear to auscultation, breathing unlabored, no rales or wheezes Abdomen: Positive bowel sounds, soft nontender to palpation, nondistended Extremities: No clubbing, cyanosis, or edema Skin: No evidence of breakdown, no evidence of rash Neurologic: Cranial nerves II through XII intact, motor strength is 5/5 in bilateral deltoid, bicep, tricep, grip, hip flexor, knee extensors, ankle dorsiflexor and plantar flexor Sensory exam normal sensation to light touch and proprioception in bilateral upper and lower extremities Cerebellar exam normal finger to nose to finger as well as heel to shin in bilateral upper and lower extremities Musculoskeletal: Full range of motion in all 4 extremities. No joint swelling   Assessment/Plan: 1. 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  Physiatrist  is providing close team supervision and 24 hour management of active medical problems listed below.  Physiatrist and rehab team continue to assess barriers to discharge/monitor patient progress toward functional and medical goals  Care Tool:  Bathing    Body parts bathed by patient: Right arm, Left arm, Chest, Abdomen, Front perineal area, Buttocks, Right upper leg, Left upper leg, Right lower leg, Left lower leg, Face         Bathing assist Assist Level: Supervision/Verbal cueing     Upper Body Dressing/Undressing Upper body dressing   What is the patient wearing?: Pull over shirt    Upper body assist Assist Level: Independent with assistive device    Lower Body Dressing/Undressing Lower body dressing      What is the patient wearing?: Underwear/pull up, Pants     Lower body assist Assist for lower body dressing: Independent with assitive device     Toileting Toileting    Toileting assist Assist for toileting: Independent with assistive device     Transfers Chair/bed transfer  Transfers assist     Chair/bed transfer assist level: Supervision/Verbal cueing     Locomotion Ambulation   Ambulation assist      Assist level: Supervision/Verbal cueing Assistive device: (rollator) Max distance: 150 ft    Walk 10 feet activity   Assist     Assist level: Supervision/Verbal cueing Assistive device: (rollator)   Walk 50 feet activity   Assist    Assist level: Supervision/Verbal cueing Assistive  device: (rollator)    Walk 150 feet activity   Assist Walk 150 feet activity did not occur: Safety/medical concerns  Assist level: Supervision/Verbal cueing Assistive device: (rollator)    Walk 10 feet on uneven surface  activity   Assist Walk 10 feet on uneven surfaces activity did not occur: Safety/medical concerns   Assist level: Supervision/Verbal cueing Assistive device: (rollator)   Wheelchair     Assist Will patient use wheelchair  at discharge?: No   Wheelchair activity did not occur: N/A         Wheelchair 50 feet with 2 turns activity    Assist    Wheelchair 50 feet with 2 turns activity did not occur: N/A       Wheelchair 150 feet activity     Assist Wheelchair 150 feet activity did not occur: N/A          Medical Problem List and Plan: 1.Functional deficitssecondary to SDH--repeat CT for 1/6 per NS. -CIR PT OT speech d/c in am  2. DVT Prophylaxis/Anticoagulation: Mechanical:Sequential compression devices, below kneeBilateral lower extremities 3.Headaches/Pain Management:Tylenol and then tramadol as first line for headache.  Improved 4. Mood:LCSW to follow for evaluation and support. 5. Neuropsych: This patientiscapable of making decisions on herown behalf. 6. Skin/Wound Care:Monitor scalp for healing. Maintain adequate nutritional and hydration status.Should be able to remove staples today 7. Fluids/Electrolytes/Nutrition:Monitor I/O. Check lytes in am. D/c IVF and encourage fluid intake.  Intake reduced  480 mL on 1/7 8. HTN: Continue Monitor qid with SBP goals <160. Will add prn clonidine in place of labetalol.  Vitals:   07/20/18 0642 07/20/18 0653  BP: (!) 171/83 (!) 158/87  Pulse: 69 73  Resp: 20 18  Temp: 99.4 F (37.4 C)   SpO2: 97% 100%   elevated systolic 1/9, monitor was on ACE-I at home, started on lisinopril 1/4 will increase to 20mg   9. Nausea/Vomiting: Continues to be an issue--question acute on chronic.Worse recently with hydrocodone.Had negative swallow study few months ago. Continue PPI. Question due to constipation. Administer two dose Miralax today. No longer using Compazine .  Recommend GI eval as outpatient, seated in wheelchair for all meals pt notes some improvement 10. Hypokalemia: Recheck labs in am.  Resolved potassium 3.5 on 07/15/2018   LOS: 6 days A FACE TO FACE EVALUATION WAS PERFORMED  Dana Porter 07/20/2018, 7:36 AM

## 2018-07-20 NOTE — Progress Notes (Signed)
Physical Therapy Session Note  Patient Details  Name: Dana Porter MRN: [id number] Date of Birth: 1934/07/12  Today's Date: 07/20/2018 PT Individual Time: 1417-1530 PT Individual Time Calculation (min): 73 min   Short Term Goals: Week 1:  PT Short Term Goal 1 (Week 1): =LTGs due to ELOS  Skilled Therapeutic Interventions/Progress Updates:    Patient received in bed with family present, pleasant and willing to participate in session, able to complete bed mobility with independence, transfers with rollator and S, bathroom transfers with independence, and steps with S today. Able to ambulate multiple distances of 150-273f in hallway with rollator and S, and completed toileting with independence this session. Continued to practice car transfers, stairs, uneven surfaces, bed mobility, and continued balance training this afternoon. Education provided to family regarding rollator height- it is correct height as she is able to stand up to full height without shoulders hiking in ears or without reaching excessively-, however recommend brake tightening before DC, social worker aware and working to get rep to room to adjust device. Otherwise worked on standing tolerance/balance and problem solving with PVC pipe puzzles and patient requiring Min-occasional Mod cues for correct completion. She was able to ambulate back to her room and was left in bed with family present, bed alarm active, all needs otherwise met this afternoon.   Therapy Documentation Precautions:  Precautions Precautions: Fall Precaution Comments: jp drain from R pariental/s/p crani Restrictions Weight Bearing Restrictions: No General:   Pain: Pain Assessment Pain Scale: 0-10 Pain Score: 0-No pain Mobility:      Therapy/Group: Individual Therapy  KDeniece ReePT, DPT, CBIS  [facility]   Pager [phone] Rehab Office [phone]  07/20/2018, 3:53 PM

## 2018-07-20 NOTE — Progress Notes (Signed)
Speech Language Pathology Discharge Summary  Patient Details  Name: Dana Porter MRN: [id number] Date of Birth: 03/14/1934  Today's Date: 07/20/2018 SLP Individual Time: 0830-0930 SLP Individual Time Calculation (min): 60 min   Skilled Therapeutic Interventions:  Skilled treatment session focused on cognition goals. SLP facilitated the session by providing handout for compensatory strategies. Pt able to read and provide examples of how to utilize strategies within her home environment. Pt with increased awareness of verbosity but isn't able to self-correct. Pt with appropriate questions regarding medicines at discharge. All questions were answered to pt's satisfaction and remaining questions written down for PA.Marland Kitchen Pt left upright in bed with all needs within reach.   Patient has met 4 of 4 long term goals.  Patient to discharge at overall Supervision level.   Clinical Impression/Discharge Summary:   Pt has made progress during skilled ST. As a result she has met 4 of 4 LTGs. Given that pt is experiencing new medical situation and is impulsive as well as verbose at baseline, she can be internally distracted and would benefit from 24 hour supervision. No further follow up ST is indicated.   Care Partner:  Caregiver Able to Provide Assistance: Yes  Type of Caregiver Assistance: Physical;Cognitive  Recommendation:  24 hour supervision/assistance      Equipment:     Reasons for discharge: Treatment goals met;Discharged from hospital   Patient/Family Agrees with Progress Made and Goals Achieved: Yes    Jacqulene Huntley 07/20/2018, 8:44 AM

## 2018-07-20 NOTE — Plan of Care (Signed)
  Problem: RH SKIN INTEGRITY Goal: RH STG SKIN FREE OF INFECTION/BREAKDOWN Description Patients skin will remain free from further infection or breakdown with min assist.  Outcome: Progressing Goal: RH STG MAINTAIN SKIN INTEGRITY WITH ASSISTANCE Description STG Maintain Skin Integrity With min Assistance.  Outcome: Progressing   Problem: RH SAFETY Goal: RH STG ADHERE TO SAFETY PRECAUTIONS W/ASSISTANCE/DEVICE Description STG Adhere to Safety Precautions With supervision Assistance/Device.  Outcome: Progressing   Problem: RH PAIN MANAGEMENT Goal: RH STG PAIN MANAGED AT OR BELOW PT'S PAIN GOAL Description < 3  Outcome: Progressing

## 2018-07-20 NOTE — Progress Notes (Signed)
Occupational Therapy Discharge Summary  Patient Details  Name: Dana Porter MRN: [id number] Date of Birth: [birth year]  Patient has met 38 of 25 long term goals due to improved balance, postural control, ability to compensate for deficits, improved attention and improved awareness.  Patient to discharge at overall Modified Independent level for dressing and toilet transfers, Supervision for shower transfers and bathing at shower level.  Patient's care partner is independent to provide the necessary cognitive assistance at discharge.  Therapy team recommends 24 hr supervision due to cognitive impairments.  Reasons goals not met: N/A  Recommendation:  Patient will not require OT follow up at this time.  Equipment: 3 in 1  Reasons for discharge: treatment goals met and discharge from hospital  Patient/family agrees with progress made and goals achieved: Yes  OT Discharge Precautions/Restrictions  Precautions Precautions: Fall Restrictions Weight Bearing Restrictions: No General   Vital Signs Therapy Vitals Pulse Rate: 82 Resp: 17 BP: (!) 152/83 Patient Position (if appropriate): Sitting Oxygen Therapy SpO2: 97 % O2 Device: Room Air Pain Pain Assessment Pain Scale: 0-10 Pain Score: 0-No pain ADL ADL Eating: Independent Where Assessed-Eating: Wheelchair Grooming: Modified independent Where Assessed-Grooming: Standing at sink Upper Body Bathing: Modified independent Where Assessed-Upper Body Bathing: Shower Lower Body Bathing: Supervision/safety Where Assessed-Lower Body Bathing: Shower Upper Body Dressing: Modified independent (Device) Where Assessed-Upper Body Dressing: Edge of bed Lower Body Dressing: Modified independent Where Assessed-Lower Body Dressing: Edge of bed Toileting: Modified independent Where Assessed-Toileting: Glass blower/designer: Diplomatic Services operational officer Method: Counselling psychologist: Consulting civil engineer: Close supervison Clinical cytogeneticist Method: Optometrist: Energy manager: Distant supervision Social research officer, government Method: Heritage manager: Gaffer Baseline Vision/History: No visual deficits Patient Visual Report: No change from baseline Vision Assessment?: No apparent visual deficits Eye Alignment: Within Functional Limits Ocular Range of Motion: Within Functional Limits Alignment/Gaze Preference: Within Defined Limits Tracking/Visual Pursuits: Able to track stimulus in all quads without difficulty Saccades: Undershoots;Decreased speed of saccadic movement Visual Fields: Other (comment) Additional Comments: very easily distracted, requires frequent redirection  Perception  Perception: Within Functional Limits Praxis Praxis: Intact Cognition Overall Cognitive Status: Within Functional Limits for tasks assessed Arousal/Alertness: Awake/alert Orientation Level: Oriented X4 Attention: Selective Selective Attention: Impaired Selective Attention Impairment: Verbal complex;Functional complex Memory: Impaired Memory Impairment: Decreased recall of new information Decreased Short Term Memory: Functional basic Awareness: Impaired Awareness Impairment: Anticipatory impairment Problem Solving: Impaired Problem Solving Impairment: Verbal complex;Functional complex Behaviors: Impulsive;Restless;Other (comment)(verbose/internally distracted - at baseline) Safety/Judgment: Impaired [employer] Scales of Cognitive Functioning: Automatic/appropriate Sensation Sensation Light Touch: Appears Intact Coordination Gross Motor Movements are Fluid and Coordinated: Yes Fine Motor Movements are Fluid and Coordinated: Yes Heel Shin Test: WNL bilaterally Motor  Motor Motor: Hemiplegia Motor - Skilled Clinical Observations: mild L hemi Motor - Discharge Observations: mild L hemi, generalized  deconditioning Mobility  Bed Mobility Bed Mobility: Rolling Right;Rolling Left;Supine to Sit;Sit to Supine Rolling Right: Independent Rolling Left: Independent Supine to Sit: Independent Sit to Supine: Independent Transfers Sit to Stand: Independent with assistive device Stand to Sit: Supervision/Verbal cueing  Trunk/Postural Assessment  Cervical Assessment Cervical Assessment: Within Functional Limits Thoracic Assessment Thoracic Assessment: Within Functional Limits Lumbar Assessment Lumbar Assessment: Within Functional Limits Postural Control Postural Control: Within Functional Limits  Balance Balance Balance Assessed: Yes Standardized Balance Assessment Standardized Balance Assessment: Berg Balance Test Berg Balance Test Sit to Stand: Able to stand without using hands and stabilize independently Standing Unsupported: Able  to stand safely 2 minutes Sitting with Back Unsupported but Feet Supported on Floor or Stool: Able to sit safely and securely 2 minutes Stand to Sit: Sits safely with minimal use of hands Transfers: Able to transfer safely, minor use of hands Standing Unsupported with Eyes Closed: Able to stand 10 seconds with supervision Standing Ubsupported with Feet Together: Able to place feet together independently and stand for 1 minute with supervision From Standing, Reach Forward with Outstretched Arm: Can reach forward >12 cm safely (5") From Standing Position, Pick up Object from Floor: Able to pick up shoe, needs supervision From Standing Position, Turn to Look Behind Over each Shoulder: Looks behind one side only/other side shows less weight shift Turn 360 Degrees: Needs close supervision or verbal cueing Standing Unsupported, Alternately Place Feet on Step/Stool: Able to stand independently and complete 8 steps >20 seconds Standing Unsupported, One Foot in Front: Able to plae foot ahead of the other independently and hold 30 seconds Standing on One Leg: Able to  lift leg independently and hold equal to or more than 3 seconds Total Score: 44 Dynamic Sitting Balance Dynamic Sitting - Balance Support: No upper extremity supported;Feet supported;During functional activity Dynamic Sitting - Level of Assistance: 5: Stand by assistance Dynamic Standing Balance Dynamic Standing - Balance Support: During functional activity;No upper extremity supported Dynamic Standing - Level of Assistance: 5: Stand by assistance Extremity/Trunk Assessment RUE Assessment RUE Assessment: Within Functional Limits LUE Assessment LUE Assessment: Within Functional Limits General Strength Comments: generalized weakness   Nadirah Socorro, [facility] 07/20/2018, 4:20 PM

## 2018-07-20 NOTE — Progress Notes (Signed)
Occupational Therapy Session Note  Patient Details  Name: Dana Porter MRN: [id number] Date of Birth: Apr 08, 1934  Today's Date: 07/20/2018 OT Individual Time: 1103-1200 OT Individual Time Calculation (min): 57 min    Short Term Goals: Week 1:  OT Short Term Goal 1 (Week 1): n/a d/t ELOS  Skilled Therapeutic Interventions/Progress Updates:    Treatment session with focus on functional mobility, organization and sequencing, and attention to task. Pt received semi-reclined in bed declining bathing/dressing this session.  Pt ambulated to bathroom with Rollator and completed toileting tasks at overall mod I level.  Pt ambulated to ADL apt with Rollator and completed furniture transfers with good recall of locking brakes.  Engaged in organization and sequencing task with "shopping" task in kitchen. Therapist instructed pt to locate items in pantry to make breakfast, lunch, and dinner for less than $25.  Pt required mod cues for sustained attention to task as she is extremely internally distracted, requiring cues to stop talking to complete task.  Pt required 20 mins to complete task as instructed due to tangential talking.  Pt required use of pen and paper for addition as she would constantly state prices but then would omit them when completing mental math.  Therapist taught pt "spot it" to address attention to task as well as challenge decreased talking. Pt initially attempting to talk while completing task, however pt able to recognize that she had increased difficulty as therapist completed card matching task prior to pt due to excess talking. Pt able to not have tangential talk for 3-4 mins during second round of "spot it".  Therapist again educated on need to talk less during functional tasks, especially more challenging tasks to increase her success.  Pt ambulated back to room and left upright in w/c with seat belt alarm on and lunch tray set up.  Therapy Documentation Precautions:   Precautions Precautions: Fall Precaution Comments: jp drain from R pariental/s/p crani Restrictions Weight Bearing Restrictions: No Pain: Pain Assessment Pain Scale: 0-10 Pain Score: 4    Therapy/Group: Individual Therapy  Rosalio Loud 07/20/2018, 12:35 PM

## 2018-07-20 NOTE — Progress Notes (Signed)
Social Work Patient ID: Dana Porter, female   DOB: [birth year], 85 y.o.   MRN: [id number] Daughter is here and has questions regarding obtaiing a blood pressure cuff and if insurance covers it and the rollator being too short for pt. Have asked [facility] to come up and look at the rollator due to brake not working and see if needs taller rollator. Will get a blood cuff prescription for daughter to see if can get coverage for or if decides pt needs moe of her own.

## 2018-07-21 MED ORDER — QUINAPRIL HCL 20 MG PO TABS: 20.0000 mg | ORAL_TABLET | Freq: Every day | ORAL | 0 refills | Status: AC

## 2018-07-21 MED ORDER — POLYETHYLENE GLYCOL 3350 17 G PO PACK: 17.0000 g | PACK | Freq: Every day | ORAL | 0 refills | Status: AC

## 2018-07-21 MED ORDER — PANTOPRAZOLE SODIUM 40 MG PO TBEC: 40.0000 mg | DELAYED_RELEASE_TABLET | Freq: Two times a day (BID) | ORAL | 0 refills | Status: DC

## 2018-07-21 NOTE — Progress Notes (Signed)
Social Work  Discharge Note  The overall goal for the admission was met for:   Discharge location: Yes-HOME TO DAUGHTER'S HOME WITH 24 HR SUPERVISION. THEN IN A FEW MONTHS GO BACK HOME TO [place]  Length of Stay: Yes-7 DAYS  Discharge activity level: Yes-SUPERVISION LEVEL  Home/community participation: Yes  Services provided included: MD, RD, PT, OT, SLP, RN, CM, Pharmacy and SW  Financial Services: Private Insurance: [facility]  Follow-up services arranged: Home Health: [facility]-PT,OT,SP, DME: [facility] 3 IN 1 and Patient/Family has no preference for HH/DME agencies  Comments (or additional information):HUSBAND WAS HERE DAILY AND PARTICIPATED IN HER THERAPIES, AWARE SHE WILL NEED 24 HR SUPERVISION FOR SAFETY AND IMPULSIVITY. DAUGHTER HAS TAKEN FMLA TO ASSIST WITH HER CARE ALSO  Patient/Family verbalized understanding of follow-up arrangements: Yes  Individual responsible for coordination of the follow-up plan: HUSBAND AND DAUGHTER  Confirmed correct DME delivered: Elease Hashimoto 07/21/2018    Elease Hashimoto

## 2018-07-21 NOTE — Discharge Instructions (Signed)
Inpatient Rehab Discharge Instructions  Dana Porter Discharge date and time: 07/21/18   Activities/Precautions/ Functional Status: Activity: no lifting, driving, or strenuous exercise till cleared by MD Diet: low fat, low cholesterol diet Wound Care: keep wound clean and dry Contact Dr. Jule Ser if you develop any problems with your incision/wound--redness, swelling, increase in pain, drainage or if you develop fever or chills.    Functional status:  ___ No restrictions     ___ Walk up steps independently _X__ 24/7 supervision/assistance   ___ Walk up steps with assistance ___ Intermittent supervision/assistance  ___ Bathe/dress independently ___ Walk with walker     ___ Bathe/dress with assistance ___ Walk Independently    ___ Shower independently _X__ Walk with supervision     ___ Shower with assistance _X__ No alcohol     ___ Return to work/school ________   Special Instructions: 1. NOTE- this is the list of your medications. Do not take anything that is not on this list.  2. Need supervision for fall prevention. No aspirin, aspirin containing products, ibuprofen/aleve/motrin, alcohol OR any blood thinners. The blood in your brain will continue to resolve over next few weeks.  3. Need to find a primary care in town for follow up on medical issues.    COMMUNITY REFERRALS UPON DISCHARGE:    Home Health:   PT, OT, SP   Agency:[facility]  Phone:[phone]   Date of last service:07/21/2018  Medical Equipment/Items Ordered:ROLLATOR ROLLING WALKER & 3 IN 1  Agency/Supplier:[facility]   [phone]   GENERAL COMMUNITY RESOURCES FOR PATIENT/FAMILY: Support Groups:[facility] SUPPORT GROUP THE SECOND TUESDAY @ 7:00-8:30 PM ON THE REHAB UNIT QUESTIONS CONTACT LUCY [phone]  My questions have been answered and I understand these instructions. I will adhere to these goals and the provided educational materials after my discharge from the hospital.  Patient/Caregiver  Signature _______________________________ Date __________  Clinician Signature _______________________________________ Date __________  Please bring this form and your medication list with you to all your follow-up doctor's appointments.

## 2018-07-21 NOTE — Progress Notes (Signed)
[place] PHYSICAL MEDICINE & REHABILITATION PROGRESS NOTE   Subjective/Complaints: No issues overntie, no abd pain or HA                 ROS: Positive for nausea no vomiting today, no chest pain or shortness of breath   Objective:   No results found. Recent Labs    07/19/18 0528  WBC 10.5  HGB 11.6*  HCT 36.1  PLT 354   Recent Labs    07/19/18 0528  NA 135  K 4.0  CL 101  CO2 28  GLUCOSE 100*  BUN 11  CREATININE 0.76  CALCIUM 8.9    Intake/Output Summary (Last 24 hours) at 07/21/2018 0723 Last data filed at 07/20/2018 1730 Gross per 24 hour  Intake 480 ml  Output -  Net 480 ml     Physical Exam: Vital Signs Blood pressure (!) 127/57, pulse 82, temperature 99.4 F (37.4 C), temperature source Oral, resp. rate 19, height 5\' 1"  (1.549 m), weight 76.8 kg, SpO2 98 %.  Head- s/p Right craniotomy fronto-parietal General: No acute distress Mood and affect are appropriate Heart: Regular rate and rhythm no rubs murmurs or extra sounds Lungs: Clear to auscultation, breathing unlabored, no rales or wheezes Abdomen: Positive bowel sounds, soft nontender to palpation, nondistended Extremities: No clubbing, cyanosis, or edema Skin: No evidence of breakdown, no evidence of rash Neurologic: Cranial nerves II through XII intact, motor strength is 5/5 in bilateral deltoid, bicep, tricep, grip, hip flexor, knee extensors, ankle dorsiflexor and plantar flexor Sensory exam normal sensation to light touch and proprioception in bilateral upper and lower extremities Cerebellar exam normal finger to nose to finger as well as heel to shin in bilateral upper and lower extremities Musculoskeletal: Full range of motion in all 4 extremities. No joint swelling   Assessment/Plan: 1. 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 Stable for D/C today F/u PCP in 3-4 weeks F/u PM&R 2 weeks See D/C summary See D/C instructions Care Tool:  Bathing    Body parts bathed by  patient: Right arm, Left arm, Chest, Abdomen, Front perineal area, Buttocks, Right upper leg, Left upper leg, Right lower leg, Left lower leg, Face         Bathing assist Assist Level: Supervision/Verbal cueing     Upper Body Dressing/Undressing Upper body dressing   What is the patient wearing?: Pull over shirt    Upper body assist Assist Level: Independent with assistive device    Lower Body Dressing/Undressing Lower body dressing      What is the patient wearing?: Underwear/pull up, Pants     Lower body assist Assist for lower body dressing: Independent with assitive device     Toileting Toileting    Toileting assist Assist for toileting: Independent with assistive device     Transfers Chair/bed transfer  Transfers assist     Chair/bed transfer assist level: Supervision/Verbal cueing     Locomotion Ambulation   Ambulation assist      Assist level: Supervision/Verbal cueing Assistive device: Other (comment)(rollator ) Max distance: 23ft    Walk 10 feet activity   Assist     Assist level: Supervision/Verbal cueing Assistive device: Other (comment)(rollator )   Walk 50 feet activity   Assist    Assist level: Supervision/Verbal cueing Assistive device: Other (comment)(rollator )    Walk 150 feet activity   Assist Walk 150 feet activity did not occur: Safety/medical concerns  Assist level: Supervision/Verbal cueing Assistive device: Other (comment)(rollator )  Walk 10 feet on uneven surface  activity   Assist Walk 10 feet on uneven surfaces activity did not occur: Safety/medical concerns   Assist level: Supervision/Verbal cueing Assistive device: Other (comment)(rollator )   Wheelchair     Assist Will patient use wheelchair at discharge?: No   Wheelchair activity did not occur: N/A         Wheelchair 50 feet with 2 turns activity    Assist    Wheelchair 50 feet with 2 turns activity did not occur: N/A        Wheelchair 150 feet activity     Assist Wheelchair 150 feet activity did not occur: N/A          Medical Problem List and Plan: 1.Functional deficitssecondary to SDH--repeat CT for 1/6 per NS. stable for D/C  2. DVT Prophylaxis/Anticoagulation: Mechanical:Sequential compression devices, below kneeBilateral lower extremities 3.Headaches/Pain Management:Tylenol and then tramadol as first line for headache.  Improved 4. Mood:LCSW to follow for evaluation and support. 5. Neuropsych: This patientiscapable of making decisions on herown behalf. 6. Skin/Wound Care:Monitor scalp for healing. Maintain adequate nutritional and hydration status. 7. Fluids/Electrolytes/Nutrition:Monitor I/O. Check lytes in am. D/c IVF and encourage fluid intake 8. HTN: Continue Monitor qid with SBP goals <160. Will add prn clonidine in place of labetalol.  Vitals:   07/20/18 1422 07/20/18 1951  BP: (!) 152/83 (!) 127/57  Pulse: 82 82  Resp: 17 19  Temp:    SpO2: 97% 98%   BP controlled 1/10 9. Nausea/Vomiting: Continues to be an issue--question acute on chronic.Worse recently with hydrocodone.Had negative swallow study few months ago. Continue PPI. Question due to constipation. Administer two dose Miralax today. No longer using Compazine .  Recommend GI eval as outpatient, seated in wheelchair for all meals pt notes some improvement 10. Hypokalemia: Recheck labs in am.  Resolved potassium 3.5 on 07/15/2018   LOS: 7 days A FACE TO FACE EVALUATION WAS PERFORMED  Erick Colace 07/21/2018, 7:23 AM

## 2018-07-21 NOTE — Plan of Care (Signed)
  Problem: Consults Goal: RH STROKE PATIENT EDUCATION Description See Patient Education module for education specifics  Outcome: Completed/Met Goal: Nutrition Consult-if indicated Outcome: Completed/Met   Problem: RH BOWEL ELIMINATION Goal: RH STG MANAGE BOWEL WITH ASSISTANCE Description STG Manage Bowel with min Assistance.  Outcome: Completed/Met Goal: RH STG MANAGE BOWEL W/MEDICATION W/ASSISTANCE Description STG Manage Bowel with Medication with mod I Assistance.  Outcome: Completed/Met   Problem: RH BLADDER ELIMINATION Goal: RH STG MANAGE BLADDER WITH ASSISTANCE Description STG Manage Bladder With min Assistance  Outcome: Completed/Met   Problem: RH SKIN INTEGRITY Goal: RH STG SKIN FREE OF INFECTION/BREAKDOWN Description Patients skin will remain free from further infection or breakdown with min assist.  Outcome: Completed/Met Goal: RH STG MAINTAIN SKIN INTEGRITY WITH ASSISTANCE Description STG Maintain Skin Integrity With min Assistance.  Outcome: Completed/Met   Problem: RH SAFETY Goal: RH STG ADHERE TO SAFETY PRECAUTIONS W/ASSISTANCE/DEVICE Description STG Adhere to Safety Precautions With supervision Assistance/Device.  Outcome: Completed/Met   Problem: RH PAIN MANAGEMENT Goal: RH STG PAIN MANAGED AT OR BELOW PT'S PAIN GOAL Description < 3  Outcome: Completed/Met

## 2018-07-21 NOTE — Discharge Summary (Addendum)
Physician Discharge Summary  Patient ID: Dana LordsJosephine Casad MRN: [id number] DOB/AGE: 83/02/1934 83 y.o.  Admit date: 07/14/2018 Discharge date: 07/21/2018  Discharge Diagnoses:  Principal Problem:   SDH (subdural hematoma) (HCC) Active Problems:   HTN (hypertension)   GERD (gastroesophageal reflux disease)   Headache   Hyponatremia   Leucocytosis   Discharged Condition: Stable  Significant Diagnostic Studies: Ct Head Wo Contrast  Result Date: 07/17/2018 CLINICAL DATA:  Follow-up examination for subdural hemorrhage. EXAM: CT HEAD WITHOUT CONTRAST TECHNIQUE: Contiguous axial images were obtained from the base of the skull through the vertex without intravenous contrast. COMPARISON:  Prior CT from 07/13/2018. FINDINGS: Brain: Postoperative changes from prior right frontoparietal craniotomy for subdural evacuation. A subdural drain has been removed since previous exam. Scattered foci of residual pneumocephalus, decreased from previous. Residual right convexity extra-axial collection measures relatively similar at 10 mm in maximal diameter. Persistent mild mass effect on the subjacent right cerebral hemisphere with trace 3 mm right-to-left midline shift, relatively similar. No hydrocephalus or ventricular trapping. Basilar cisterns remain patent. Remainder the brain is stable in appearance. No other new acute intracranial abnormality. Vascular: No hyperdense vessel. Skull: Postoperative changes from prior right craniotomy. Overlying soft tissue swelling with emphysema. Skin staples remain in place. Sinuses/Orbits: Globes and orbital soft tissues demonstrate no acute finding. Paranasal sinuses remain largely clear. No mastoid effusion. Other: None. IMPRESSION: 1. Postoperative changes from prior right craniotomy for subdural evacuation with interval removal of subdural drain and decreased pneumocephalus. Residual right convexity extra-axial collection similar in size measuring up to 10 mm in maximal  thickness. Associated mass effect with trace right-to-left midline shift relatively stable. 2. No other new acute intracranial abnormality. Electronically Signed   By: Rise MuBenjamin  McClintock M.D.   On: 07/17/2018 04:54   Ct Head Wo Contrast  Result Date: 07/13/2018 CLINICAL DATA:  Head trauma follow-up. Status post craniotomy EXAM: CT HEAD WITHOUT CONTRAST TECHNIQUE: Contiguous axial images were obtained from the base of the skull through the vertex without intravenous contrast. COMPARISON:  07/10/2018 FINDINGS: Brain: Status post right frontoparietal craniotomy for hematoma evacuation with placement of subdural drainage catheter. Greatly decreased size of right convexity extra-axial collection, now measuring 9 mm in thickness, previously 30 mm. There is mild pneumocephalus. Trace leftward midline shift, markedly improved. Vascular: Unremarkable Skull: Status post right frontoparietal craniotomy. Sinuses/Orbits: Paranasal sinuses and mastoid air cells are clear. Normal orbits. Other: None. IMPRESSION: 1. Status post right frontoparietal craniotomy for hematoma evacuation with placement of subdural drainage catheter. 2. Greatly decreased size of right convexity extra-axial collection, now measuring 9 mm in thickness, previously 30 mm. 3. Trace leftward midline shift, markedly improved. Electronically Signed   By: Deatra RobinsonKevin  Herman M.D.   On: 07/13/2018 03:16   Ct Head Wo Contrast  Result Date: 07/10/2018 CLINICAL DATA:  85 y/o F; multiple falls over the last 3 days. Left-sided weakness. EXAM: CT HEAD WITHOUT CONTRAST TECHNIQUE: Contiguous axial images were obtained from the base of the skull through the vertex without intravenous contrast. COMPARISON:  None. FINDINGS: Brain: Large extra-axial hemorrhage over the right cerebral convexity, predominantly low in attenuation, but with several high attenuation foci likely representing interval acute hemorrhage and/or retracted clot. The hematoma measures up to 30 mm in  thickness. There is associated mass effect on the right cerebral hemisphere with effacement of right lateral ventricle and 11 mm of right-to-left midline shift at the level of septum pellucidum. The hematoma has a somewhat lentiform appearance, but there is no associated calvarial fracture, and is  it is probably subdural. No stroke, focal mass effect of brain parenchyma, intraventricular hemorrhage, hydrocephalus, or additional focus of extra-axial hemorrhage is identified. Vascular: Calcific atherosclerosis of the carotid siphons. No hyperdense vessel identified. Skull: Normal. Negative for fracture or focal lesion. Sinuses/Orbits: No acute finding. Other: None. IMPRESSION: 1. Large mixed attenuation extra-axial hemorrhage over the right cerebral convexity. The hemorrhage measures up to 30 mm in thickness and results in mass effect with 11 mm of right-to-left midline shift. No herniation at this time. 2. No calvarial fracture or brain parenchymal hemorrhage identified. Critical Value/emergent results were called by telephone at the time of interpretation on 07/10/2018 at 6:45 pm to Dr. Adela LankFloyd, who verbally acknowledged these results. Electronically Signed   By: Mitzi HansenLance  Furusawa-Stratton M.D.   On: 07/10/2018 18:46    Labs:  Basic Metabolic Panel: BMP Latest Ref Rng & Units 07/19/2018 07/15/2018 07/11/2018  Glucose 70 - 99 mg/dL 161(W100(H) 960(A151(H) 540(J122(H)  BUN 8 - 23 mg/dL 11 <8(J<5(L) 10  Creatinine 0.44 - 1.00 mg/dL 1.910.76 4.780.67 2.950.71  Sodium 135 - 145 mmol/L 135 134(L) 141  Potassium 3.5 - 5.1 mmol/L 4.0 3.5 3.4(L)  Chloride 98 - 111 mmol/L 101 98 109  CO2 22 - 32 mmol/L 28 26 23   Calcium 8.9 - 10.3 mg/dL 8.9 6.2(Z8.7(L) 3.0(Q8.0(L)    CBC: CBC Latest Ref Rng & Units 07/19/2018 07/15/2018 07/10/2018  WBC 4.0 - 10.5 K/uL 10.5 9.7 -  Hemoglobin 12.0 - 15.0 g/dL 11.6(L) 11.1(L) 15.6(H)  Hematocrit 36.0 - 46.0 % 36.1 34.2(L) 46.0  Platelets 150 - 400 K/uL 354 300 -    CBG: No results for input(s): GLUCAP in the last 168  hours.  Brief HPI:   Dana Porter is an 85 year old female with history of HTN, GERD type symptoms otherwise relatively good health was admitted with 3-day history of falls.  She was found to have a large acute on subacute right SDH with effacement of right lateral ventricle and 11 mm right to left midline shift.  She was taken to the OR emergently for right frontoparietal craniotomy for evacuation of SDH by Dr. Newell CoralNudelman.  Follow-up CT postop showed decrease in size of SDH with marked improvement in midline shift.  She continued to have issues with headaches as well as bouts of vomiting, left-sided weakness and poor safety awareness with distractibility.  CIR was recommended due to functional decline.   Hospital Course: Dana Porter was admitted to rehab 07/14/2018 for inpatient therapies to consist of PT, ST and OT at least three hours five days a week. Past admission physiatrist, therapy team and rehab RN have worked together to provide customized collaborative inpatient rehab.  Headaches have improved and hydrocodone was discontinued by discharge.  Blood pressures have been monitored on twice daily basis and have showed improvement in control with increasing quinapril to 20 mg daily.  MiraLAX was added and bowel program has been augmented with resolution of constipation.  Due to reports of acute on chronic nausea/GERD symptoms, Protonix was increased to twice daily basis with resolution of symptoms and her p.o. intake has improved.   Follow-up CT of head 1/6 showed postop change which stable mass-effect post drain removal.  Right cranial incision is healing well without any signs or symptoms of infection and staples were DC'd on 1/09.  Incision is C/D/I.  Follow-up labs showed that hypokalemia had resolved.  Acute blood loss anemia is resolving and leukocytosis has resolved.  She has made steady progress during her rehab stay and is at  supervision level.  She will continue to receive further follow-up  home health PT/OT/speech therapy by [facility] at home after discharge   Rehab course: During patient's stay in rehab team conference was held to monitor patient's progress, set goals and discuss barriers to discharge. At admission, patient required min assist with ADL tasks and mobility. She exhibited verbosity and required min assist for redirection and min assist for higher level cognitive tasks.  She  has had improvement in activity tolerance, balance, postural control as well as ability to compensate for deficits. She has had improvement in functional use LUE  and LLE as well as improvement in awareness.  She is able to complete dressing and toileting transfers at modified independent level and requires supervision for bathing at shower level and for shower transfers.  She requires supervision for transfers and for ambulating 150 to 200 feet with Rollator.  She is showing improvement in awareness of her verbosity but is not able to self-correct.  Supervision is recommended due to internal distraction.  Family education was completed regarding all aspects of care and mobility. Disposition: Home  Diet: Regular  Special Instructions: 1.  No alcohol.  Do not use any supplements or medications that not listed on discharge papers. 2.  No driving or strenuous activity until cleared by MD.  Needs supervision.  Discharge Instructions    Ambulatory referral to Gastroenterology   Complete by:  As directed    GERD/abdominal pain past meals   What is the reason for referral?:  Other   Ambulatory referral to Physical Medicine Rehab   Complete by:  As directed    1-2 weeks transitional care appt     Allergies as of 07/21/2018      Reactions   Dyazide [hydrochlorothiazide W-triamterene] Other (See Comments)   Raised body temp      Medication List    STOP taking these medications   aspirin 81 MG chewable tablet   CALCIUM 500 PO   Fish Oil 1000 MG Caps   Garlic 200 MG Tabs   Magnesium 250 MG  Tabs     TAKE these medications   acetaminophen 325 MG tablet Commonly known as:  TYLENOL Take 1-2 tablets (325-650 mg total) by mouth every 4 (four) hours as needed for mild pain.   levothyroxine 50 MCG tablet Commonly known as:  SYNTHROID, LEVOTHROID Take 50 mcg by mouth daily before breakfast.   multivitamin with minerals tablet Take 1 tablet by mouth daily.   pantoprazole 40 MG tablet Commonly known as:  PROTONIX Take 1 tablet (40 mg total) by mouth 2 (two) times daily before a meal.   polyethylene glycol packet Commonly known as:  MIRALAX / GLYCOLAX Take 17 g by mouth daily.   quinapril 20 MG tablet Commonly known as:  ACCUPRIL Take 1 tablet (20 mg total) by mouth daily. What changed:    medication strength  how much to take  when to take this   simvastatin 20 MG tablet Commonly known as:  ZOCOR Take 20 mg by mouth daily.   Vitamin D-3 25 MCG (1000 UT) Caps Take 2,000 Units by mouth daily with lunch.      Follow-up Information    Kirsteins, Victorino Sparrow, MD Follow up.   Specialty:  Physical Medicine and Rehabilitation Why:  Office will call you for follow up appointment Contact information: [address] [place] [address] [phone]        Shirlean Kelly, MD. Call.   Specialty:  Neurosurgery  Why:  for follow up appointment Contact information: [address] [place] [address] [phone]        [facility] Follow up.   Specialty:  Gastroenterology Why:  Office will call you with appointment Contact information: [address] [place] [address] [phone]       McLean-Scocuzza, Pasty Spillers, MD. Call.   Specialty:  Internal Medicine Why:  for post hospital follow up Contact information: [address] [place] [address] [phone]           Signed: Jacquelynn Cree 07/23/2018, 12:01 PM

## 2018-07-23 DIAGNOSIS — D72829 Elevated white blood cell count, unspecified: Secondary | ICD-10-CM

## 2018-07-23 DIAGNOSIS — R519 Headache, unspecified: Secondary | ICD-10-CM

## 2018-07-23 DIAGNOSIS — E871 Hypo-osmolality and hyponatremia: Secondary | ICD-10-CM

## 2018-07-23 DIAGNOSIS — R51 Headache: Secondary | ICD-10-CM

## 2018-07-23 DIAGNOSIS — K219 Gastro-esophageal reflux disease without esophagitis: Secondary | ICD-10-CM

## 2018-07-23 DIAGNOSIS — I10 Essential (primary) hypertension: Secondary | ICD-10-CM

## 2018-07-25 ENCOUNTER — Telehealth: Payer: Self-pay

## 2018-07-25 ENCOUNTER — Ambulatory Visit (INDEPENDENT_AMBULATORY_CARE_PROVIDER_SITE_OTHER): Payer: Medicare Other | Admitting: Internal Medicine

## 2018-07-25 ENCOUNTER — Encounter: Payer: Self-pay | Admitting: Internal Medicine

## 2018-07-25 VITALS — BP 142/78 | HR 86 | Temp 98.2°F | Ht 60.0 in | Wt 162.4 lb

## 2018-07-25 DIAGNOSIS — K449 Diaphragmatic hernia without obstruction or gangrene: Secondary | ICD-10-CM

## 2018-07-25 DIAGNOSIS — M545 Low back pain, unspecified: Secondary | ICD-10-CM | POA: Insufficient documentation

## 2018-07-25 DIAGNOSIS — M25552 Pain in left hip: Secondary | ICD-10-CM

## 2018-07-25 DIAGNOSIS — K219 Gastro-esophageal reflux disease without esophagitis: Secondary | ICD-10-CM

## 2018-07-25 DIAGNOSIS — E559 Vitamin D deficiency, unspecified: Secondary | ICD-10-CM

## 2018-07-25 DIAGNOSIS — R110 Nausea: Secondary | ICD-10-CM | POA: Insufficient documentation

## 2018-07-25 DIAGNOSIS — S065XAA Traumatic subdural hemorrhage with loss of consciousness status unknown, initial encounter: Secondary | ICD-10-CM

## 2018-07-25 DIAGNOSIS — R109 Unspecified abdominal pain: Secondary | ICD-10-CM

## 2018-07-25 DIAGNOSIS — Z1329 Encounter for screening for other suspected endocrine disorder: Secondary | ICD-10-CM | POA: Diagnosis not present

## 2018-07-25 DIAGNOSIS — I10 Essential (primary) hypertension: Secondary | ICD-10-CM | POA: Diagnosis not present

## 2018-07-25 DIAGNOSIS — Z1389 Encounter for screening for other disorder: Secondary | ICD-10-CM

## 2018-07-25 DIAGNOSIS — R739 Hyperglycemia, unspecified: Secondary | ICD-10-CM

## 2018-07-25 DIAGNOSIS — M25551 Pain in right hip: Secondary | ICD-10-CM

## 2018-07-25 DIAGNOSIS — E876 Hypokalemia: Secondary | ICD-10-CM

## 2018-07-25 DIAGNOSIS — E079 Disorder of thyroid, unspecified: Secondary | ICD-10-CM

## 2018-07-25 DIAGNOSIS — Z9889 Other specified postprocedural states: Secondary | ICD-10-CM

## 2018-07-25 DIAGNOSIS — E785 Hyperlipidemia, unspecified: Secondary | ICD-10-CM

## 2018-07-25 DIAGNOSIS — E039 Hypothyroidism, unspecified: Secondary | ICD-10-CM | POA: Insufficient documentation

## 2018-07-25 DIAGNOSIS — S065X9A Traumatic subdural hemorrhage with loss of consciousness of unspecified duration, initial encounter: Secondary | ICD-10-CM

## 2018-07-25 NOTE — Telephone Encounter (Signed)
Transitional Care call  Patient name: Dana Porter) DOB: (Dec 01, 1933) 1. Are you/is patient experiencing any problems since coming home? (NO) a. Are there any questions regarding any aspect of care? (NA) 2. Are there any questions regarding medications administration/dosing? (NO) a. Are meds being taken as prescribed? (NA) b. "Patient should review meds with caller to confirm"  3. Have there been any falls? (NO)  4. Has Home Health been to the house and/or have they contacted you? (NO, WILL CHECK:  CALLED [facility] TO SEE WHAT WAS THE DELAY IN SCHEDULING APTS FOR PT/OT/ST, ACCORDING TO THE SCHEDULER THEY WERE UNABLE TO TREAT PATIENT DUE TO PATIENTS INSURANCE BEING OUT OF NETWORK AND WORKS ONLY IN [place].)   a. If not, have you tried to contact them? (NA) b. Can we help you contact them? (NA) 5. Are bowels and bladder emptying properly? (YES) a. Are there any unexpected incontinence issues? (NA) b. If applicable, is patient following bowel/bladder programs? (NA) 6. Any fevers, problems with breathing, unexpected pain? (NO) 7. Are there any skin problems or new areas of breakdown? (NO) 8. Has the patient/family member arranged specialty MD follow up (ie cardiology/neurology/renal/surgical/etc.)?  (YES) a. Can we help arrange? (NA) 9. Does the patient need any other services or support that we can help arrange? (NO) 10. Are caregivers following through as expected in assisting the patient? (YES) 11. Has the patient quit smoking, drinking alcohol, or using drugs as recommended? (NA)  Appointment date/time (08-01-2018 / 140PM), arrive time (120PM) and who it is with here (EUNICE FIRST THEN DR. Wynn Banker) [phone]

## 2018-07-25 NOTE — Patient Instructions (Addendum)
Tylenol 325 mg up to 8 pills per day as needed   Warm prune, increase water or fiber intake (fruits, veggies, oatmeal) Metamucil or Benefiber or fiber     Constipation, Adult Constipation is when a person has fewer bowel movements in a week than normal, has difficulty having a bowel movement, or has stools that are dry, hard, or larger than normal. Constipation may be caused by an underlying condition. It may become worse with age if a person takes certain medicines and does not take in enough fluids. Follow these instructions at home: Eating and drinking   Eat foods that have a lot of fiber, such as fresh fruits and vegetables, whole grains, and beans.  Limit foods that are high in fat, low in fiber, or overly processed, such as french fries, hamburgers, cookies, candies, and soda.  Drink enough fluid to keep your urine clear or pale yellow. General instructions  Exercise regularly or as told by your health care provider.  Go to the restroom when you have the urge to go. Do not hold it in.  Take over-the-counter and prescription medicines only as told by your health care provider. These include any fiber supplements.  Practice pelvic floor retraining exercises, such as deep breathing while relaxing the lower abdomen and pelvic floor relaxation during bowel movements.  Watch your condition for any changes.  Keep all follow-up visits as told by your health care provider. This is important. Contact a health care provider if:  You have pain that gets worse.  You have a fever.  You do not have a bowel movement after 4 days.  You vomit.  You are not hungry.  You lose weight.  You are bleeding from the anus.  You have thin, pencil-like stools. Get help right away if:  You have a fever and your symptoms suddenly get worse.  You leak stool or have blood in your stool.  Your abdomen is bloated.  You have severe pain in your abdomen.  You feel dizzy or you faint. This  information is not intended to replace advice given to you by your health care provider. Make sure you discuss any questions you have with your health care provider. Document Released: 03/26/2004 Document Revised: 01/16/2016 Document Reviewed: 12/17/2015 Elsevier Interactive Patient Education  2019 [employer].

## 2018-07-28 ENCOUNTER — Encounter: Payer: Self-pay | Admitting: Internal Medicine

## 2018-07-28 NOTE — Progress Notes (Addendum)
Chief Complaint  Patient presents with  . Establish Care   New patient with daughter Aram Beecham and husband recently d/c from hospital s/p right craniotomy  1. S/p right frontoparietal craniotomy for hematoma with subdural drainage cath now out since surgery 07/10/18 2/2 large brain hemorrhage with right to left shift -pt is doing better with serial CTs head supporting improvements  She reports in the fall 2019 she hit her head on an open microwave door and has fallen multiple times since and when daughter noticed she was hold left arm inward is when she sought medical attention and her mother was more confused than baseline. She at that time gave her mother 2-81 mg aspirin. They report NS thinks bleed could have been present for 6-8 weeks.   2. H/o thyroid problems (hypo-) on synthyroid 50 mcg qd  3. HTN/HLD had cholesterol issues since her 53s. On zocor 20 mg qhs and quinapril 20 mg qd which was increased in the hospital BP slightly elevated today  4. Low back and hip pain worse with PT recently in rehab in the hospital. This is new for her no medications tried  5. H/o GERD/hiatal hernia with Chronic nausea and abdominal pain protonix is helping pain husband reports she was supposed to have EGD in [place]   Review of Systems  Constitutional: Negative for weight loss.  HENT: Negative for hearing loss.   Eyes: Negative for blurred vision.  Respiratory: Negative for shortness of breath.   Cardiovascular: Negative for chest pain.  Gastrointestinal: Negative for abdominal pain.  Musculoskeletal: Positive for back pain and joint pain. Negative for falls.  Skin: Negative for rash.  Neurological: Negative for dizziness and headaches.  Psychiatric/Behavioral: Negative for depression and memory loss.   Past Medical History:  Diagnosis Date  . GERD (gastroesophageal reflux disease)   . History of chicken pox   . HTN (hypertension)   . Hyperlipidemia   . Hypothyroid   . OA (osteoarthritis) of knee     Past Surgical History:  Procedure Laterality Date  . ANKLE SURGERY Right    due to fracture  . CRANIOTOMY Right 07/10/2018   Procedure: CRANIOTOMY HEMATOMA EVACUATION SUBDURAL;  Surgeon: Shirlean Kelly, MD;  Location: [facility] OR;  Service: Neurosurgery;  Laterality: Right;  . LAPAROSCOPIC CHOLECYSTECTOMY    . OVARIAN CYST REMOVAL    . TOTAL KNEE ARTHROPLASTY Bilateral    Family History  Problem Relation Age of Onset  . Heart disease Father   . Heart disease Sister   . Cancer Son    Social History   Socioeconomic History  . Marital status: Married    Spouse name: Not on file  . Number of children: Not on file  . Years of education: Not on file  . Highest education level: Not on file  Occupational History  . Not on file  Social Needs  . Financial resource strain: Not on file  . Food insecurity:    Worry: Not on file    Inability: Not on file  . Transportation needs:    Medical: Not on file    Non-medical: Not on file  Tobacco Use  . Smoking status: Never Smoker  . Smokeless tobacco: Never Used  Substance and Sexual Activity  . Alcohol use: Never    Frequency: Never  . Drug use: Never  . Sexual activity: Not Currently  Lifestyle  . Physical activity:    Days per week: Not on file    Minutes per session: Not on file  .  Stress: Not on file  Relationships  . Social connections:    Talks on phone: Not on file    Gets together: Not on file    Attends religious service: Not on file    Active member of club or organization: Not on file    Attends meetings of clubs or organizations: Not on file    Relationship status: Not on file  . Intimate partner violence:    Fear of current or ex partner: Not on file    Emotionally abused: Not on file    Physically abused: Not on file    Forced sexual activity: Not on file  Other Topics Concern  . Not on file  Social History Narrative   4 kids    Married 60 + years as of 07/25/2018    Never smoker    Owns guns,wears seat  belt, safe in relationship    HS ed. Housewife retired    Current Meds  Medication Sig  . acetaminophen (TYLENOL) 325 MG tablet Take 1-2 tablets (325-650 mg total) by mouth every 4 (four) hours as needed for mild pain.  . Cholecalciferol (VITAMIN D-3) 25 MCG (1000 UT) CAPS Take 2,000 Units by mouth daily with lunch.  . levothyroxine (SYNTHROID, LEVOTHROID) 50 MCG tablet Take 50 mcg by mouth daily before breakfast.  . Multiple Vitamins-Minerals (MULTIVITAMIN WITH MINERALS) tablet Take 1 tablet by mouth daily.  . pantoprazole (PROTONIX) 40 MG tablet Take 1 tablet (40 mg total) by mouth 2 (two) times daily before a meal.  . polyethylene glycol (MIRALAX / GLYCOLAX) packet Take 17 g by mouth daily.  . quinapril (ACCUPRIL) 20 MG tablet Take 1 tablet (20 mg total) by mouth daily.  . simvastatin (ZOCOR) 20 MG tablet Take 20 mg by mouth daily.   Allergies  Allergen Reactions  . Dyazide [Hydrochlorothiazide W-Triamterene] Other (See Comments)    Raised body temp  . Sulfa Antibiotics     ?allergy     Recent Results (from the past 2160 hour(s))  Protime-INR     Status: None   Collection Time: 07/10/18  5:19 PM  Result Value Ref Range   Prothrombin Time 13.1 11.4 - 15.2 seconds   INR 1.00     Comment: Performed at [facility], [address], [place], [place] [address]  APTT     Status: None   Collection Time: 07/10/18  5:19 PM  Result Value Ref Range   aPTT 27 24 - 36 seconds    Comment: Performed at [facility], [address], [place], [place] [address]  CBC     Status: Abnormal   Collection Time: 07/10/18  5:19 PM  Result Value Ref Range   WBC 12.1 (H) 4.0 - 10.5 K/uL   RBC 5.05 3.87 - 5.11 MIL/uL   Hemoglobin 14.1 12.0 - 15.0 g/dL   HCT 84.5 36.4 - 68.0 %   MCV 89.3 80.0 - 100.0 fL   MCH 27.9 26.0 - 34.0 pg   MCHC 31.3 30.0 - 36.0 g/dL   RDW 32.1 22.4 - 82.5 %   Platelets 302 150 - 400 K/uL   nRBC 0.0 0.0 - 0.2 %    Comment: Performed at [facility] Lab,  [address], [place], [place] [address]  Differential     Status: Abnormal   Collection Time: 07/10/18  5:19 PM  Result Value Ref Range   Neutrophils Relative % 67 %   Neutro Abs 8.1 (H) 1.7 - 7.7  K/uL   Lymphocytes Relative 25 %   Lymphs Abs 3.0 0.7 - 4.0 K/uL   Monocytes Relative 7 %   Monocytes Absolute 0.8 0.1 - 1.0 K/uL   Eosinophils Relative 0 %   Eosinophils Absolute 0.1 0.0 - 0.5 K/uL   Basophils Relative 0 %   Basophils Absolute 0.0 0.0 - 0.1 K/uL   Immature Granulocytes 1 %   Abs Immature Granulocytes 0.09 (H) 0.00 - 0.07 K/uL    Comment: Performed at [facility], [address] [place], [place] [address]  Comprehensive metabolic panel     Status: Abnormal   Collection Time: 07/10/18  5:19 PM  Result Value Ref Range   Sodium 138 135 - 145 mmol/L   Potassium 3.4 (L) 3.5 - 5.1 mmol/L   Chloride 101 98 - 111 mmol/L   CO2 27 22 - 32 mmol/L   Glucose, Bld 138 (H) 70 - 99 mg/dL   BUN 11 8 - 23 mg/dL   Creatinine, Ser 2.95 0.44 - 1.00 mg/dL   Calcium 9.8 8.9 - 62.1 mg/dL   Total Protein 7.1 6.5 - 8.1 g/dL   Albumin 3.8 3.5 - 5.0 g/dL   AST 26 15 - 41 U/L   ALT 16 0 - 44 U/L   Alkaline Phosphatase 58 38 - 126 U/L   Total Bilirubin 0.8 0.3 - 1.2 mg/dL   GFR calc non Af Amer >60 >60 mL/min   GFR calc Af Amer >60 >60 mL/min   Anion gap 10 5 - 15    Comment: Performed at [facility], [address], [place], [place] [address]  I-stat troponin, ED     Status: None   Collection Time: 07/10/18  5:27 PM  Result Value Ref Range   Troponin i, poc 0.00 0.00 - 0.08 ng/mL   Comment 3            Comment: Due to the release kinetics of cTnI, a negative result within the first hours of the onset of symptoms does not rule out myocardial infarction with certainty. If myocardial infarction is still suspected, repeat the test at appropriate intervals.   I-Stat Chem 8, ED     Status: Abnormal   Collection Time: 07/10/18  5:28 PM  Result Value Ref Range   Sodium 138 135  - 145 mmol/L   Potassium 3.4 (L) 3.5 - 5.1 mmol/L   Chloride 100 98 - 111 mmol/L   BUN 13 8 - 23 mg/dL   Creatinine, Ser 7.84 0.44 - 1.00 mg/dL   Glucose, Bld 696 (H) 70 - 99 mg/dL   Calcium, Ion 2.95 2.84 - 1.40 mmol/L   TCO2 29 22 - 32 mmol/L   Hemoglobin 15.6 (H) 12.0 - 15.0 g/dL   HCT 13.2 44.0 - 10.2 %  Type and screen     Status: None   Collection Time: 07/10/18 10:05 PM  Result Value Ref Range   ABO/RH(D) A POS    Antibody Screen NEG    Sample Expiration      07/13/2018 Performed at [facility], [address] [place], [place] [address]   ABO/Rh     Status: None   Collection Time: 07/10/18 10:05 PM  Result Value Ref Range   ABO/RH(D)      A POS Performed at [facility], [address], [place], [place] [address]   MRSA PCR Screening     Status: None   Collection Time: 07/11/18  2:06  AM  Result Value Ref Range   MRSA by PCR NEGATIVE NEGATIVE    Comment:        The GeneXpert MRSA Assay (FDA approved for NASAL specimens only), is one component of a comprehensive MRSA colonization surveillance program. It is not intended to diagnose MRSA infection nor to guide or monitor treatment for MRSA infections. Performed at [facility], [address] [place], [place] [address]   Basic metabolic panel     Status: Abnormal   Collection Time: 07/11/18  2:08 AM  Result Value Ref Range   Sodium 141 135 - 145 mmol/L   Potassium 3.4 (L) 3.5 - 5.1 mmol/L   Chloride 109 98 - 111 mmol/L   CO2 23 22 - 32 mmol/L   Glucose, Bld 122 (H) 70 - 99 mg/dL   BUN 10 8 - 23 mg/dL   Creatinine, Ser 4.13 0.44 - 1.00 mg/dL   Calcium 8.0 (L) 8.9 - 10.3 mg/dL   GFR calc non Af Amer >60 >60 mL/min   GFR calc Af Amer >60 >60 mL/min   Anion gap 9 5 - 15    Comment: Performed at [facility], [address], [place], [place] [address]  CBC WITH DIFFERENTIAL     Status: Abnormal   Collection Time: 07/15/18  9:05 AM  Result Value Ref Range   WBC 9.7 4.0 - 10.5 K/uL     RBC 3.86 (L) 3.87 - 5.11 MIL/uL   Hemoglobin 11.1 (L) 12.0 - 15.0 g/dL   HCT 02.7 (L) 25.3 - 66.4 %   MCV 88.6 80.0 - 100.0 fL   MCH 28.8 26.0 - 34.0 pg   MCHC 32.5 30.0 - 36.0 g/dL   RDW 40.3 47.4 - 25.9 %   Platelets 300 150 - 400 K/uL   nRBC 0.0 0.0 - 0.2 %   Neutrophils Relative % 76 %   Neutro Abs 7.4 1.7 - 7.7 K/uL   Lymphocytes Relative 17 %   Lymphs Abs 1.6 0.7 - 4.0 K/uL   Monocytes Relative 6 %   Monocytes Absolute 0.6 0.1 - 1.0 K/uL   Eosinophils Relative 1 %   Eosinophils Absolute 0.1 0.0 - 0.5 K/uL   Basophils Relative 0 %   Basophils Absolute 0.0 0.0 - 0.1 K/uL   Immature Granulocytes 0 %   Abs Immature Granulocytes 0.02 0.00 - 0.07 K/uL    Comment: Performed at [facility], [address], [place], [place] [address]  Comprehensive metabolic panel     Status: Abnormal   Collection Time: 07/15/18  9:05 AM  Result Value Ref Range   Sodium 134 (L) 135 - 145 mmol/L   Potassium 3.5 3.5 - 5.1 mmol/L   Chloride 98 98 - 111 mmol/L   CO2 26 22 - 32 mmol/L   Glucose, Bld 151 (H) 70 - 99 mg/dL   BUN <5 (L) 8 - 23 mg/dL   Creatinine, Ser 5.64 0.44 - 1.00 mg/dL   Calcium 8.7 (L) 8.9 - 10.3 mg/dL   Total Protein 5.9 (L) 6.5 - 8.1 g/dL   Albumin 2.8 (L) 3.5 - 5.0 g/dL   AST 28 15 - 41 U/L   ALT 22 0 - 44 U/L   Alkaline Phosphatase 48 38 - 126 U/L   Total Bilirubin 0.7 0.3 - 1.2 mg/dL   GFR calc non Af Amer >60 >60 mL/min   GFR calc Af Amer >60 >60 mL/min   Anion gap 10 5 -  15    Comment: Performed at [facility], [address] [place], [place] [address]  Basic metabolic panel     Status: Abnormal   Collection Time: 07/19/18  5:28 AM  Result Value Ref Range   Sodium 135 135 - 145 mmol/L   Potassium 4.0 3.5 - 5.1 mmol/L   Chloride 101 98 - 111 mmol/L   CO2 28 22 - 32 mmol/L   Glucose, Bld 100 (H) 70 - 99 mg/dL   BUN 11 8 - 23 mg/dL   Creatinine, Ser 6.040.76 0.44 - 1.00 mg/dL   Calcium 8.9 8.9 - 54.010.3 mg/dL   GFR calc non Af Amer >60 >60 mL/min   GFR  calc Af Amer >60 >60 mL/min   Anion gap 6 5 - 15    Comment: Performed at [facility], [address] [place], [place] [address]  CBC     Status: Abnormal   Collection Time: 07/19/18  5:28 AM  Result Value Ref Range   WBC 10.5 4.0 - 10.5 K/uL   RBC 4.14 3.87 - 5.11 MIL/uL   Hemoglobin 11.6 (L) 12.0 - 15.0 g/dL   HCT 14.736.1 82.936.0 - 56.246.0 %   MCV 87.2 80.0 - 100.0 fL   MCH 28.0 26.0 - 34.0 pg   MCHC 32.1 30.0 - 36.0 g/dL   RDW 13.013.9 86.511.5 - 78.415.5 %   Platelets 354 150 - 400 K/uL   nRBC 0.0 0.0 - 0.2 %    Comment: Performed at [facility], [address] [place], [place] [address]   Objective  Body mass index is 31.72 kg/m. Wt Readings from Last 3 Encounters:  07/25/18 162 lb 6.4 oz (73.7 kg)  07/14/18 169 lb 5 oz (76.8 kg)   Temp Readings from Last 3 Encounters:  07/25/18 98.2 F (36.8 C)  07/20/18 99.4 F (37.4 C) (Oral)  07/14/18 98.3 F (36.8 C) (Oral)   BP Readings from Last 3 Encounters:  07/25/18 (!) 142/78  07/20/18 (!) 127/57  07/14/18 (!) 167/92   Pulse Readings from Last 3 Encounters:  07/25/18 86  07/20/18 82  07/14/18 72    Physical Exam Vitals signs and nursing note reviewed.  Constitutional:      Appearance: Normal appearance. She is well-developed. She is obese.  HENT:     Head: Normocephalic and atraumatic.     Nose: Nose normal.     Mouth/Throat:     Mouth: Mucous membranes are moist.     Pharynx: Oropharynx is clear.  Eyes:     Conjunctiva/sclera: Conjunctivae normal.     Pupils: Pupils are equal, round, and reactive to light.  Cardiovascular:     Rate and Rhythm: Normal rate and regular rhythm.     Pulses: Normal pulses.     Heart sounds: Normal heart sounds.  Pulmonary:     Effort: Pulmonary effort is normal.     Breath sounds: Normal breath sounds.  Musculoskeletal:     Lumbar back: She exhibits tenderness.  Skin:    General: Skin is warm and dry.       Neurological:     General: No focal deficit present.     Mental  Status: She is alert and oriented to person, place, and time. Mental status is at baseline.     Gait: Gait normal.     Comments: Walking w/o cane    Psychiatric:        Attention and Perception: Attention and perception normal.  Mood and Affect: Mood and affect normal.        Speech: Speech normal.        Behavior: Behavior normal. Behavior is cooperative.        Thought Content: Thought content normal.        Cognition and Memory: Cognition and memory normal.        Judgment: Judgment normal.     Assessment   1. S/p right frontoparietal craniotomy for hematoma with subdural drainage cath now out since surgery 07/10/18 2/2 large brain hemorrhage with right to left shift -pt is doing better 2. Hypothyroidism  3. HTN/HLD had cholesterol issues since her 4650s  4. Low back and hip pain worse with PT recently  5. H/o GERD/hiatal hernia with Chronic nausea and abdominal pain  6. HM  Plan   1. PT going to do 3x per week has not started since been out of hospital yet, f/u Dr. Mauri PoleNeudleman 08/22/2018 she will have repeat CT at this time as well though serial Cts since are improving  Doing well since craniotomy  2. Check TSH. Cont meds  3. Prev lipitor caused aches tolerating zocor  Cont meds monitor BP  Check fasting labs  4. Consider Xray low back and hips in future  5. Consider EGD in future husband states she was supposed [place] get in [place] before moved per other MD but will get NS clearance 1st too soon s/p surgery for EGD currently informed family  Cont ppi sx's improved  6. Need to get copy of all vxs prior PCP and if not proof will need vaccine  Flu, Tdap, shingrix, pna 23, prevnar  sch fasting labs reviewed CMET, CBC 1/4 and 07/19/2018   Out of age window pap  Mammogram consider in future declines for now  Colonoscopy get records prior PCP  DEXA consider in future  Never smoker  Consider dermatology in future   Eye exam in 2017 eye MD Dr. Jacquiline Doe.J. Shaneyfelt (nephew)  Dentist  Dr. Derrick RavelJerry Pohlmann (sone)  NS Dr. Mauri PoleNeudleman   [facility] requested records today  Provider: Dr. French Anaracy McLean-Scocuzza-Internal Medicine

## 2018-07-31 NOTE — Progress Notes (Signed)
Note has been faxed.

## 2018-08-01 ENCOUNTER — Other Ambulatory Visit: Payer: Self-pay

## 2018-08-01 ENCOUNTER — Other Ambulatory Visit: Payer: Self-pay | Admitting: Neurosurgery

## 2018-08-01 ENCOUNTER — Encounter: Payer: Medicare Other | Attending: Registered Nurse | Admitting: Registered Nurse

## 2018-08-01 ENCOUNTER — Encounter: Payer: Self-pay | Admitting: Registered Nurse

## 2018-08-01 VITALS — BP 125/75 | HR 87 | Ht 60.0 in | Wt 161.0 lb

## 2018-08-01 DIAGNOSIS — S065X9A Traumatic subdural hemorrhage with loss of consciousness of unspecified duration, initial encounter: Secondary | ICD-10-CM

## 2018-08-01 DIAGNOSIS — Z8719 Personal history of other diseases of the digestive system: Secondary | ICD-10-CM

## 2018-08-01 DIAGNOSIS — S065XAA Traumatic subdural hemorrhage with loss of consciousness status unknown, initial encounter: Secondary | ICD-10-CM

## 2018-08-01 DIAGNOSIS — I10 Essential (primary) hypertension: Secondary | ICD-10-CM | POA: Diagnosis not present

## 2018-08-01 NOTE — Progress Notes (Signed)
Subjective:    Patient ID: Dana Porter, female    DOB: [birth year], 85 y.o.   MRN: [id number]  HPI: Dana Porter is a 85 y.o. female who is here for Transitional Care Visit Today. She presented to [facility] on 07/10/2018 because of repeated falls in the home. CT of the Head WO Contrast was ordered.  CT Head WO Contrast:  IMPRESSION: 1. Large mixed attenuation extra-axial hemorrhage over the right cerebral convexity. The hemorrhage measures up to 30 mm in thickness and results in mass effect with 11 mm of right-to-left midline shift. No herniation at this time. 2. No calvarial fracture or brain parenchymal hemorrhage identified. She was taken to the OR [facility] for Right Craniotomy Hematoma  Evacuation of SDH by Dr. Jule Ser.   She was admitted to Rehabilitation on 07/14/2018 and discharge to daughter's home on 07/21/2018. [facility] is scheduled to see patient on 08/03/2018.  Today she is here for follow up of her SDH, Hypertension and GERD. She denies any pain at this time. She rated her pain 3 on flow-sheet. . Also reports she has a good appetite.   Husband and daughter in room, all questions answered.   Pain Inventory Average Pain 5 Pain Right Now 3 My pain is aching  In the last 24 hours, has pain interfered with the following? General activity 0 Relation with others 0 Enjoyment of life 0 What TIME of day is your pain at its worst? daytime evening Sleep (in general) Fair  Pain is worse with: sitting Pain improves with: heat/ice and medication Relief from Meds: 10  Mobility walk with assistance use a walker ability to climb steps?  yes  Function Do you have any goals in this area?  yes  Neuro/Psych No problems in this area  Prior Studies x-rays CT/MRI  Physicians involved in your care Any changes since last visit?  no   Family History  Problem Relation Age of Onset  . Heart disease Father   . Heart disease Sister   . Cancer Son     Social History   Socioeconomic History  . Marital status: Married    Spouse name: Not on file  . Number of children: Not on file  . Years of education: Not on file  . Highest education level: Not on file  Occupational History  . Not on file  Social Needs  . Financial resource strain: Not on file  . Food insecurity:    Worry: Not on file    Inability: Not on file  . Transportation needs:    Medical: Not on file    Non-medical: Not on file  Tobacco Use  . Smoking status: Never Smoker  . Smokeless tobacco: Never Used  Substance and Sexual Activity  . Alcohol use: Never    Frequency: Never  . Drug use: Never  . Sexual activity: Not Currently  Lifestyle  . Physical activity:    Days per week: Not on file    Minutes per session: Not on file  . Stress: Not on file  Relationships  . Social connections:    Talks on phone: Not on file    Gets together: Not on file    Attends religious service: Not on file    Active member of club or organization: Not on file    Attends meetings of clubs or organizations: Not on file    Relationship status: Not on file  Other Topics Concern  . Not on file  Social History  Narrative   4 kids (3 sons, 1 daughter)   Married 70 + years as of 07/25/2018 appt   Never smoker    Owns guns,wears seat belt, safe in relationship    HS ed. Housewife retired    Past Surgical History:  Procedure Laterality Date  . ANKLE SURGERY Right    due to fracture with plate and screws ~1997  . APPENDECTOMY    . CRANIOTOMY Right 07/10/2018   Procedure: CRANIOTOMY HEMATOMA EVACUATION SUBDURAL;  Surgeon: Shirlean Kelly, MD;  [facility];  Service: Neurosurgery;  Laterality: Right;  . JOINT REPLACEMENT     TKR b/l 2011 and 2015  . LAPAROSCOPIC CHOLECYSTECTOMY    . OVARIAN CYST REMOVAL     1972  . TOTAL KNEE ARTHROPLASTY Bilateral   . TUBAL LIGATION     Past Medical History:  Diagnosis Date  . GERD (gastroesophageal reflux disease)   . Hiatal hernia    . History of chicken pox   . HTN (hypertension)   . Hyperlipidemia   . Hypothyroid   . OA (osteoarthritis) of knee    BP 125/75   Pulse 87   Ht 5' (1.524 m)   Wt 161 lb (73 kg)   SpO2 95%   BMI 31.44 kg/m   Opioid Risk Score:   Fall Risk Score:  `1  Depression screen PHQ 2/9  Depression screen PHQ 2/9 08/01/2018  Decreased Interest 0  Down, Depressed, Hopeless 0  PHQ - 2 Score 0    Review of Systems  Constitutional: Negative.   HENT: Negative.   Eyes: Negative.   Respiratory: Negative.   Cardiovascular: Negative.   Gastrointestinal: Negative.   Endocrine: Negative.   Genitourinary: Negative.   Musculoskeletal: Negative.   Skin: Negative.   Allergic/Immunologic: Negative.   Neurological: Negative.   Hematological: Negative.   Psychiatric/Behavioral: Negative.   All other systems reviewed and are negative.      Objective:   Physical Exam Vitals signs and nursing note reviewed.  Constitutional:      Appearance: Normal appearance.  Neck:     Musculoskeletal: Normal range of motion and neck supple.  Cardiovascular:     Rate and Rhythm: Normal rate and regular rhythm.     Pulses: Normal pulses.     Heart sounds: Normal heart sounds.  Pulmonary:     Effort: Pulmonary effort is normal.     Breath sounds: Normal breath sounds.  Musculoskeletal:     Comments: Normal Muscle Bulk and Muscle Testing Reveals:  Upper Extremities: Full ROM and Muscle Strength 5/5  Lower Extremities: Full ROM and Muscle Strength 5/5 Arises from Table with ease Narrow Based Gait  Skin:    General: Skin is warm and dry.  Neurological:     Mental Status: She is alert and oriented to person, place, and time.  Psychiatric:        Mood and Affect: Mood normal.        Behavior: Behavior normal.           Assessment & Plan:  1. SDH: Subdural hematoma: Continue HEP as Tolerated. [facility] scheduled on 08/03/2018. She has a Neurology Appointment Scheduled.  2. Hypertension:  Continue current medication regimen. Continue to Monitor.  3. GERD: She denies N/V: Continue Protonix: Has a GI Referral Placed.   30 minutes of face to face patient care time was spent during this visit. All questions were encouraged and answered.  F/U in 4-6 weeks with Dr. Wynn Banker

## 2018-08-02 ENCOUNTER — Telehealth: Payer: Self-pay | Admitting: Internal Medicine

## 2018-08-02 NOTE — Telephone Encounter (Signed)
Copied from CRM [id number]. Topic: Quick Communication - See Telephone Encounter >> Aug 01, 2018  5:07 PM Gean Birchwood R wrote: Patients daughter is calling in wanting to confirm how many hours to fast and if its ok for her to take thyroid meds and bp meds Thursday morning with water. Pts lab is scheduled for 8:45am  CB# [phone] ( may leave detailed message for Aram Beecham)

## 2018-08-03 ENCOUNTER — Other Ambulatory Visit: Payer: Medicare Other

## 2018-08-03 ENCOUNTER — Other Ambulatory Visit (INDEPENDENT_AMBULATORY_CARE_PROVIDER_SITE_OTHER): Payer: Medicare Other

## 2018-08-03 DIAGNOSIS — R739 Hyperglycemia, unspecified: Secondary | ICD-10-CM

## 2018-08-03 DIAGNOSIS — Z1389 Encounter for screening for other disorder: Secondary | ICD-10-CM

## 2018-08-03 DIAGNOSIS — E039 Hypothyroidism, unspecified: Secondary | ICD-10-CM

## 2018-08-03 DIAGNOSIS — E559 Vitamin D deficiency, unspecified: Secondary | ICD-10-CM | POA: Diagnosis not present

## 2018-08-03 DIAGNOSIS — I10 Essential (primary) hypertension: Secondary | ICD-10-CM | POA: Diagnosis not present

## 2018-08-03 DIAGNOSIS — E876 Hypokalemia: Secondary | ICD-10-CM

## 2018-08-03 DIAGNOSIS — E785 Hyperlipidemia, unspecified: Secondary | ICD-10-CM | POA: Diagnosis not present

## 2018-08-03 DIAGNOSIS — Z1329 Encounter for screening for other suspected endocrine disorder: Secondary | ICD-10-CM | POA: Diagnosis not present

## 2018-08-03 DIAGNOSIS — E079 Disorder of thyroid, unspecified: Secondary | ICD-10-CM

## 2018-08-03 LAB — LIPID PANEL
Cholesterol: 167 mg/dL (ref 0–200)
HDL: 67.1 mg/dL (ref >39.00)
LDL Cholesterol: 85 mg/dL (ref 0–99)
NonHDL: 99.44
TRIGLYCERIDES: 72 mg/dL (ref 0.0–149.0)
Total CHOL/HDL Ratio: 2
VLDL: 14.4 mg/dL (ref 0.0–40.0)

## 2018-08-03 LAB — TSH: TSH: 1.54 u[IU]/mL (ref 0.35–4.50)

## 2018-08-03 LAB — HEMOGLOBIN A1C: Hgb A1c MFr Bld: 5.8 % (ref 4.6–6.5)

## 2018-08-03 LAB — MAGNESIUM: Magnesium: 2.3 mg/dL (ref 1.5–2.5)

## 2018-08-03 LAB — VITAMIN D 25 HYDROXY (VIT D DEFICIENCY, FRACTURES): VITD: 84.56 ng/mL (ref 30.00–100.00)

## 2018-08-03 NOTE — Telephone Encounter (Signed)
Spoken to patient. This morning at appointment.

## 2018-08-03 NOTE — Addendum Note (Signed)
Addended by: WIGGINS, Kallen Delatorre N on: 08/03/2018 08:02 AM   Modules accepted: Orders  

## 2018-08-04 LAB — URINALYSIS, ROUTINE W REFLEX MICROSCOPIC
BILIRUBIN UA: NEGATIVE
GLUCOSE, UA: NEGATIVE
Ketones, UA: NEGATIVE
Leukocytes, UA: NEGATIVE
Nitrite, UA: NEGATIVE
Protein, UA: NEGATIVE
RBC, UA: NEGATIVE
Specific Gravity, UA: 1.024 (ref 1.005–1.030)
Urobilinogen, Ur: 1 mg/dL (ref 0.2–1.0)
pH, UA: 7 (ref 5.0–7.5)

## 2018-08-14 DIAGNOSIS — R109 Unspecified abdominal pain: Secondary | ICD-10-CM

## 2018-08-14 DIAGNOSIS — I10 Essential (primary) hypertension: Secondary | ICD-10-CM

## 2018-08-14 DIAGNOSIS — M7918 Myalgia, other site: Secondary | ICD-10-CM

## 2018-08-14 DIAGNOSIS — G8194 Hemiplegia, unspecified affecting left nondominant side: Secondary | ICD-10-CM | POA: Diagnosis not present

## 2018-08-14 DIAGNOSIS — R112 Nausea with vomiting, unspecified: Secondary | ICD-10-CM

## 2018-08-14 DIAGNOSIS — Z96653 Presence of artificial knee joint, bilateral: Secondary | ICD-10-CM

## 2018-08-14 DIAGNOSIS — W19XXXD Unspecified fall, subsequent encounter: Secondary | ICD-10-CM | POA: Diagnosis not present

## 2018-08-14 DIAGNOSIS — R296 Repeated falls: Secondary | ICD-10-CM

## 2018-08-14 DIAGNOSIS — K219 Gastro-esophageal reflux disease without esophagitis: Secondary | ICD-10-CM

## 2018-08-14 DIAGNOSIS — G9389 Other specified disorders of brain: Secondary | ICD-10-CM | POA: Diagnosis not present

## 2018-08-14 DIAGNOSIS — R51 Headache: Secondary | ICD-10-CM

## 2018-08-14 DIAGNOSIS — S065X9D Traumatic subdural hemorrhage with loss of consciousness of unspecified duration, subsequent encounter: Secondary | ICD-10-CM | POA: Diagnosis not present

## 2018-08-22 ENCOUNTER — Encounter: Payer: Medicare Other | Attending: Registered Nurse

## 2018-08-22 ENCOUNTER — Encounter: Payer: Self-pay | Admitting: Physical Medicine & Rehabilitation

## 2018-08-22 ENCOUNTER — Ambulatory Visit
Admission: RE | Admit: 2018-08-22 | Discharge: 2018-08-22 | Disposition: A | Discharge status: Home / Self Care | Payer: Medicare Other | Source: Ambulatory Visit | Attending: Neurosurgery | Admitting: Neurosurgery

## 2018-08-22 ENCOUNTER — Ambulatory Visit (HOSPITAL_BASED_OUTPATIENT_CLINIC_OR_DEPARTMENT_OTHER): Payer: Medicare Other | Admitting: Physical Medicine & Rehabilitation

## 2018-08-22 VITALS — BP 121/80 | HR 82 | Resp 14 | Ht 60.0 in | Wt 161.0 lb

## 2018-08-22 DIAGNOSIS — S065X9A Traumatic subdural hemorrhage with loss of consciousness of unspecified duration, initial encounter: Secondary | ICD-10-CM

## 2018-08-22 DIAGNOSIS — S065XAA Traumatic subdural hemorrhage with loss of consciousness status unknown, initial encounter: Secondary | ICD-10-CM

## 2018-08-22 DIAGNOSIS — I10 Essential (primary) hypertension: Secondary | ICD-10-CM | POA: Insufficient documentation

## 2018-08-22 DIAGNOSIS — Z8719 Personal history of other diseases of the digestive system: Secondary | ICD-10-CM | POA: Insufficient documentation

## 2018-08-22 NOTE — Patient Instructions (Signed)
Keep up with home exercises  Try walking outside weather permitting

## 2018-08-22 NOTE — Progress Notes (Signed)
Subjective:    Patient ID: Dana Porter, female    DOB: Jan 28, 1934, 85 y.o.   MRN: [id number] 85 year old female with history of HTN, GERD type symptoms otherwise relatively good health was admitted with 3-day history of falls.  She was found to have a large acute on subacute right SDH on 07/10/2018 with effacement of right lateral ventricle and 11 mm right to left midline shift.  She was taken to the OR emergently for right frontoparietal craniotomy for evacuation of SDH by Dr. Newell Coral.  Follow-up CT postop showed decrease in size of SDH with marked improvement in midline shift.  She continued to have issues with headaches as well as bouts of vomiting, left-sided weakness and poor safety awareness with distractibility HPI  The patient and her husband were visiting [place] staying with the daughter when patient had her falls.  According to husband the patient had had some falls in [place] and some decline in function even several months ago. According to husband and daughter as well as patient she is back to baseline or even a little bit better than she was a several months ago No falls, no longer using cane, cooking and cleaning Mod I ADLs No longer constipated, no heartburn Has finished up home health therapy and is still performing home exercise program. Pain Inventory Average Pain 0 Pain Right Now 0 My pain is aching  In the last 24 hours, has pain interfered with the following? General activity 0 Relation with others 0 Enjoyment of life 0 What TIME of day is your pain at its worst? daytime, evening, night  Sleep (in general) Fair  Pain is worse with: no pain Pain improves with: no pain Relief from Meds: no pain  Mobility walk without assistance use a cane ability to climb steps?  yes  Function retired I need assistance with the following:  household duties and shopping  Neuro/Psych No problems in this area  Prior Studies Any changes since last visit?   no  Physicians involved in your care Any changes since last visit?  no   Family History  Problem Relation Age of Onset  . Heart disease Father   . Heart disease Sister   . Cancer Son    Social History   Socioeconomic History  . Marital status: Married    Spouse name: Not on file  . Number of children: Not on file  . Years of education: Not on file  . Highest education level: Not on file  Occupational History  . Not on file  Social Needs  . Financial resource strain: Not on file  . Food insecurity:    Worry: Not on file    Inability: Not on file  . Transportation needs:    Medical: Not on file    Non-medical: Not on file  Tobacco Use  . Smoking status: Never Smoker  . Smokeless tobacco: Never Used  Substance and Sexual Activity  . Alcohol use: Never    Frequency: Never  . Drug use: Never  . Sexual activity: Not Currently  Lifestyle  . Physical activity:    Days per week: Not on file    Minutes per session: Not on file  . Stress: Not on file  Relationships  . Social connections:    Talks on phone: Not on file    Gets together: Not on file    Attends religious service: Not on file    Active member of club or organization: Not on file  Attends meetings of clubs or organizations: Not on file    Relationship status: Not on file  Other Topics Concern  . Not on file  Social History Narrative   4 kids (3 sons, 1 daughter)   Married 55 + years as of 07/25/2018 appt   Never smoker    Owns guns,wears seat belt, safe in relationship    HS ed. Housewife retired    Past Surgical History:  Procedure Laterality Date  . ANKLE SURGERY Right    due to fracture with plate and screws ~1997  . APPENDECTOMY    . CRANIOTOMY Right 07/10/2018   Procedure: CRANIOTOMY HEMATOMA EVACUATION SUBDURAL;  Surgeon: Shirlean Kelly, MD;  Location: [facility] OR;  Service: Neurosurgery;  Laterality: Right;  . JOINT REPLACEMENT     TKR b/l 2011 and 2015  . LAPAROSCOPIC CHOLECYSTECTOMY    .  OVARIAN CYST REMOVAL     1972  . TOTAL KNEE ARTHROPLASTY Bilateral   . TUBAL LIGATION     Past Medical History:  Diagnosis Date  . GERD (gastroesophageal reflux disease)   . Hiatal hernia   . History of chicken pox   . HTN (hypertension)   . Hyperlipidemia   . Hypothyroid   . OA (osteoarthritis) of knee    BP 121/80   Pulse 82   Resp 14   Ht 5' (1.524 m)   Wt 161 lb (73 kg)   SpO2 93%   BMI 31.44 kg/m   Opioid Risk Score:   Fall Risk Score:  `1  Depression screen PHQ 2/9  Depression screen PHQ 2/9 08/01/2018  Decreased Interest 0  Down, Depressed, Hopeless 0  PHQ - 2 Score 0    Review of Systems  Constitutional: Negative.   HENT: Negative.   Eyes: Negative.   Respiratory: Negative.   Cardiovascular: Negative.   Gastrointestinal: Negative.   Endocrine: Negative.   Genitourinary: Negative.   Musculoskeletal: Negative.   Skin: Negative.   Allergic/Immunologic: Negative.   Neurological: Negative.   Hematological: Negative.   Psychiatric/Behavioral: Negative.   All other systems reviewed and are negative.      Objective:   Physical Exam Vitals signs and nursing note reviewed.  Constitutional:      Appearance: Normal appearance.  HENT:     Head: Normocephalic and atraumatic.     Mouth/Throat:     Mouth: Mucous membranes are moist.  Eyes:     Extraocular Movements: Extraocular movements intact.     Conjunctiva/sclera: Conjunctivae normal.     Pupils: Pupils are equal, round, and reactive to light.  Neck:     Musculoskeletal: Normal range of motion.  Skin:    General: Skin is warm and dry.  Neurological:     General: No focal deficit present.     Mental Status: She is alert and oriented to person, place, and time.  Psychiatric:        Mood and Affect: Mood normal.        Behavior: Behavior normal.   Motor strength is 5/5 bilateral deltoid, bicep, tricep, grip, hip flexor, knee extensor, ankle dorsiflexor and plantar flexor Cerebellar testing with  normal finger-to-nose and heel-to-shin bilaterally. Finger to thumb opposition equal bilaterally. Romberg is negative  Able to do tandem gait        Assessment & Plan:  1.  History of subdural hematoma on the right status post craniotomy by Dr. Newell Coral.  She has had an excellent recovery she is back to her baseline.  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 We discussed the need to continue her home exercise program to strengthen the lower extremities as well as improve her symptoms of fatigue. I do not think she needs any further formal physical therapy or occupational or speech therapy I will see her back on a as needed basis She plans to return to [place] in March which I think is appropriate

## 2018-08-23 ENCOUNTER — Other Ambulatory Visit: Payer: Self-pay | Admitting: Internal Medicine

## 2018-08-23 ENCOUNTER — Telehealth: Payer: Self-pay | Admitting: Internal Medicine

## 2018-08-23 DIAGNOSIS — K219 Gastro-esophageal reflux disease without esophagitis: Secondary | ICD-10-CM

## 2018-08-23 MED ORDER — PANTOPRAZOLE SODIUM 40 MG PO TBEC: 40.0000 mg | DELAYED_RELEASE_TABLET | Freq: Every day | ORAL | 3 refills | Status: AC

## 2018-08-23 NOTE — Telephone Encounter (Signed)
Refilled protonix want pt to take 1x per day not 2, 30 minutes before dinner  Inform pt and family   TMS

## 2018-08-23 NOTE — Telephone Encounter (Signed)
Copied from CRM [id number]. Topic: Quick Communication - Rx Refill/Question >> Aug 23, 2018 10:22 AM Arlyss Gandy, NT wrote: Medication: pantoprazole (PROTONIX) 40 MG tablet   Has the patient contacted their pharmacy? Yes.   (Agent: If no, request that the patient contact the pharmacy for the refill.) (Agent: If yes, when and what did the pharmacy advise?)  Preferred Pharmacy (with phone number or street name): [id number] [place], [place] - [address] [phone] (Phone) [phone] (Fax)    Agent: Please be advised that RX refills may take up to 3 business days. We ask that you follow-up with your pharmacy.

## 2018-08-23 NOTE — Telephone Encounter (Signed)
Patient last seen on 07/25/18.  Medication filled by historical provider.  Is it ok  to fill Protonix 40 MG?

## 2018-08-25 NOTE — Telephone Encounter (Signed)
Attempted to call patient- left message on voice mail to call back.

## 2018-08-25 NOTE — Telephone Encounter (Signed)
Left message for patient to return call back. PEC may give and obtain information.  

## 2018-08-30 ENCOUNTER — Ambulatory Visit: Payer: Medicare Other | Admitting: Internal Medicine

## 2018-09-04 ENCOUNTER — Telehealth (HOSPITAL_COMMUNITY): Payer: Self-pay | Admitting: Physical Medicine and Rehabilitation

## 2018-09-04 ENCOUNTER — Encounter: Payer: Self-pay | Admitting: Physical Medicine and Rehabilitation

## 2018-09-04 NOTE — Telephone Encounter (Signed)
Contacted patient's daughter regarding attempts by GI to set appointment. Patient/family had requested appointment prior to discharge. Daughter indicated that patient was not interested in following up with GI at this time.

## 2018-11-02 ENCOUNTER — Telehealth: Payer: Self-pay | Admitting: Internal Medicine

## 2018-11-02 NOTE — Telephone Encounter (Signed)
The patient has been removed from your panel.

## 2018-11-02 NOTE — Telephone Encounter (Signed)
FYI, Pt moved back to [place]. Pt states she will be staying in [place].

## 2018-11-02 NOTE — Telephone Encounter (Signed)
Noted  TMS 

## 2018-11-02 NOTE — Telephone Encounter (Signed)
Please take her off my panel for now   Thanks TMS

## 2019-07-01 IMAGING — CT CT HEAD W/O CM
4 series · 17 of 47 positions shown, 19 images · non-contrast
Comparison: 07/17/2018

CLINICAL DATA: Follow-up subdural hematoma status post craniotomy
for evacuation.

EXAM:
CT HEAD WITHOUT CONTRAST
TECHNIQUE: Contiguous axial images were obtained from the base of the skull
through the vertex without intravenous contrast.

[Series 2: head 5.00 hr40 s3 ibhc · axial · 0.44mm/px · z∈[-646,-521]mm · 7 of 35 slices shown, 9 images]
[im 5/35  brain]
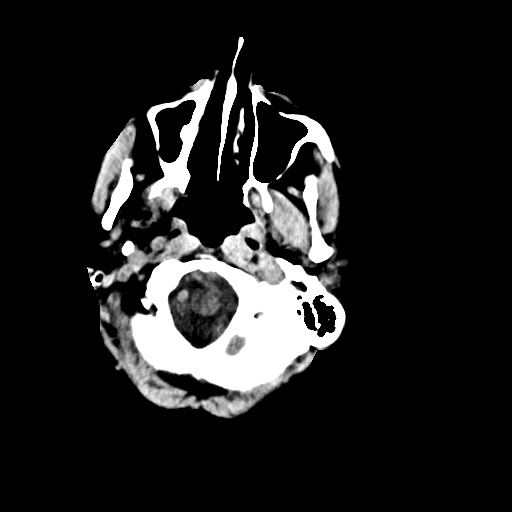
[im 5/35  bone]
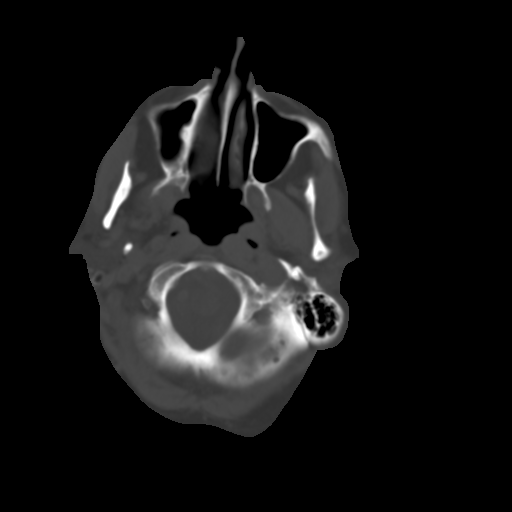
[im 9/35  brain]
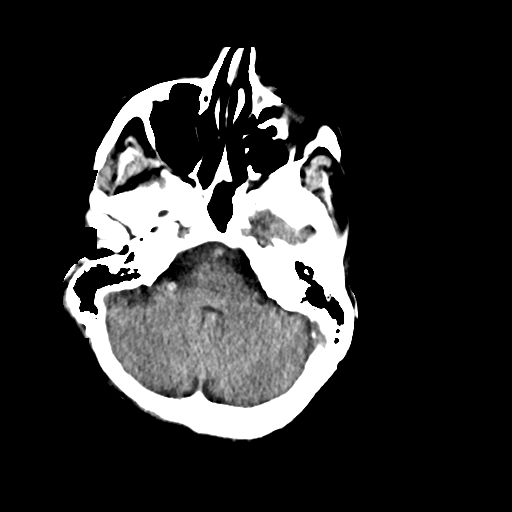
[im 13/35  brain]
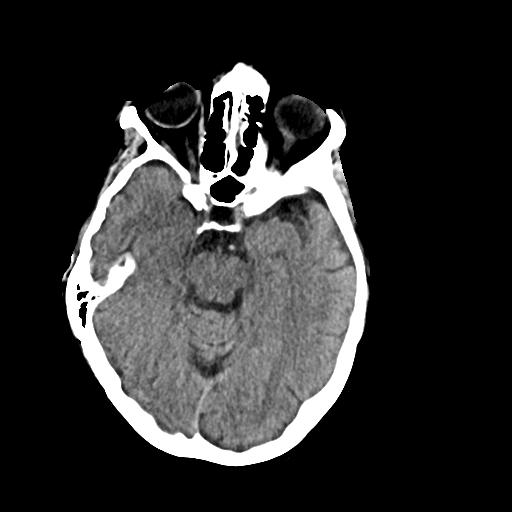
[im 18/35  brain]
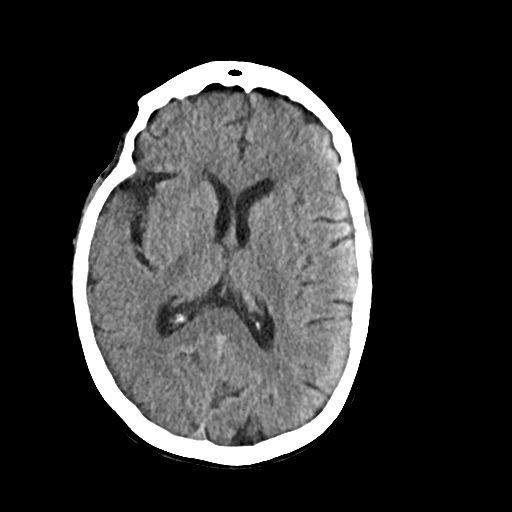
[im 22/35  brain]
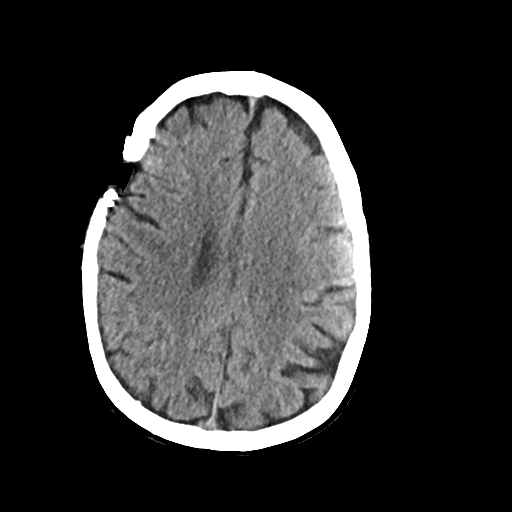
[im 22/35  bone]
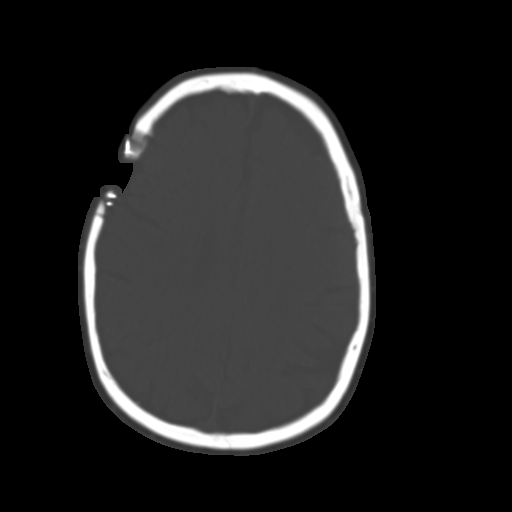
[im 26/35  brain]
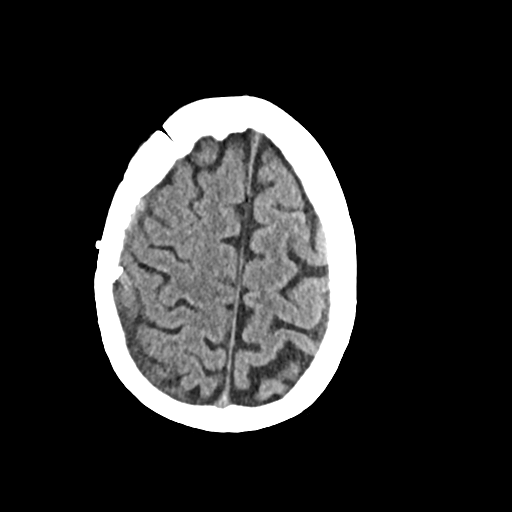
[im 30/35  brain]
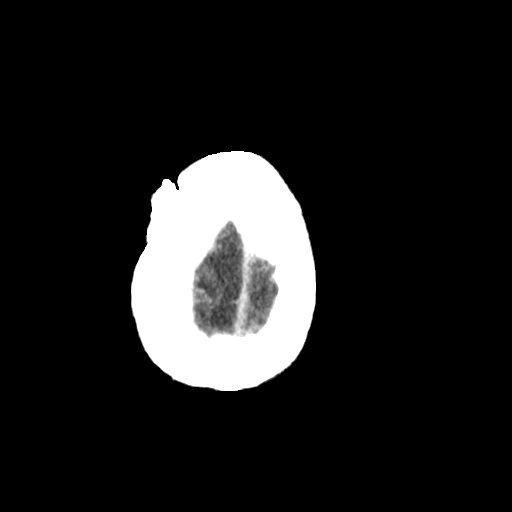

[Series 3: head 2.00 hr60 s3 bone · axial · 0.44mm/px · z∈[-652,-590]mm · 4 of 88 slices shown]
[im 9/88  bone]
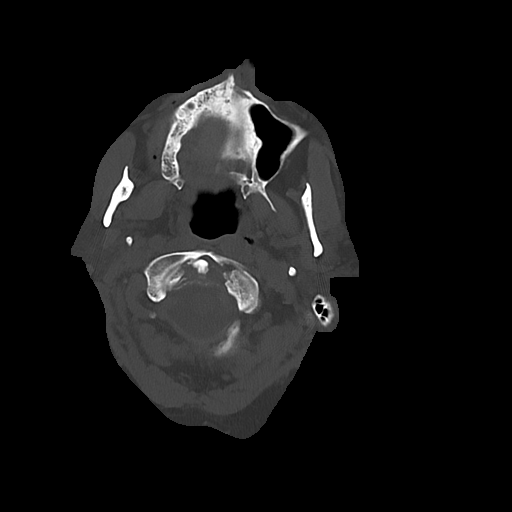
[im 18/88  bone]
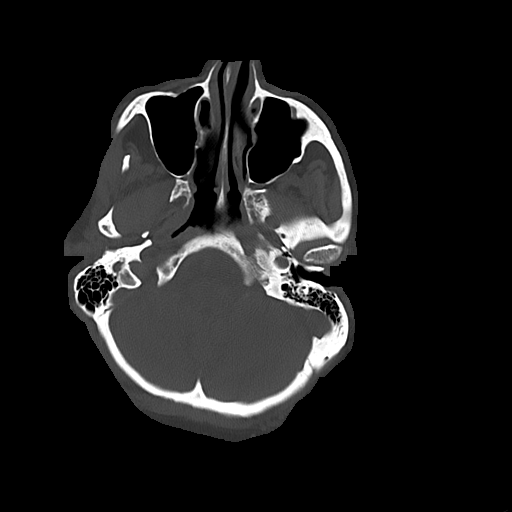
[im 27/88  bone]
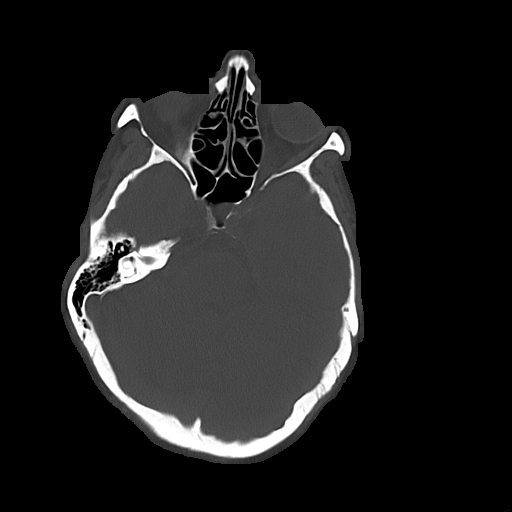
[im 40/88  bone]
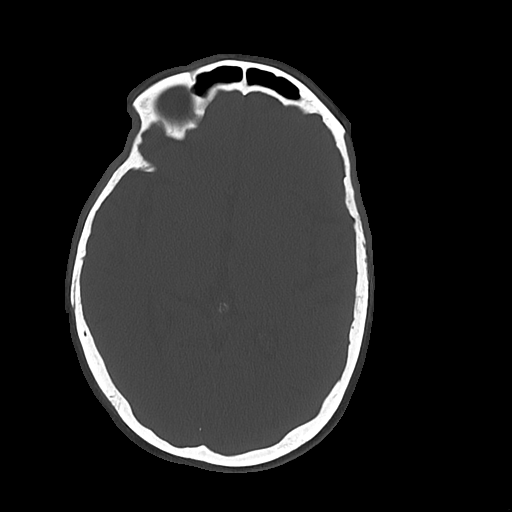

[Series 4: head 3.00 hr40 s3 sag · sagittal · 0.32mm/px · 3 of 78 slices shown]
[im 26/78  brain]
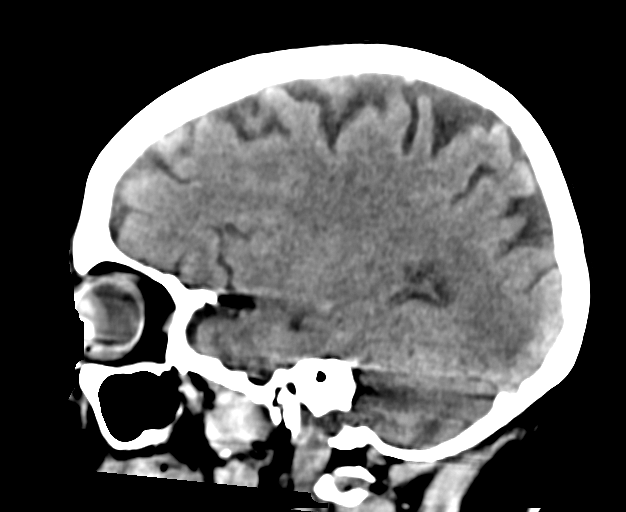
[im 39/78  brain]
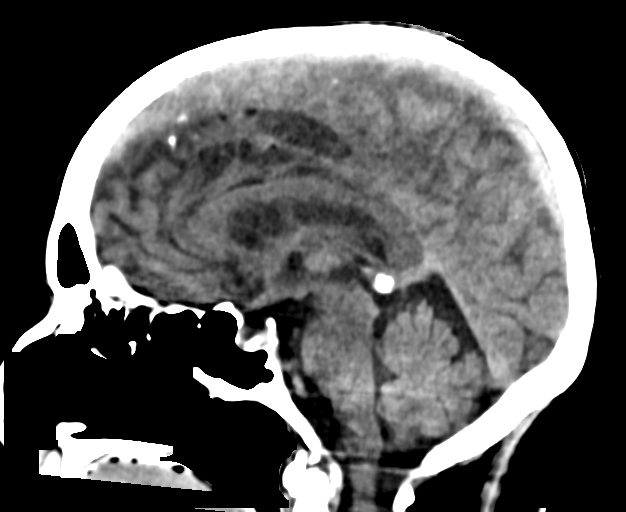
[im 52/78  brain]
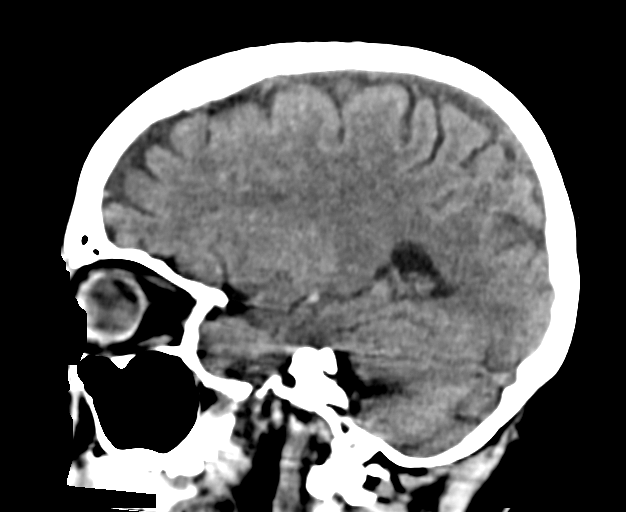

[Series 6: head 3.00 hr40 s3 cor · coronal · 0.31mm/px · 3 of 98 slices shown]
[im 33/98  brain]
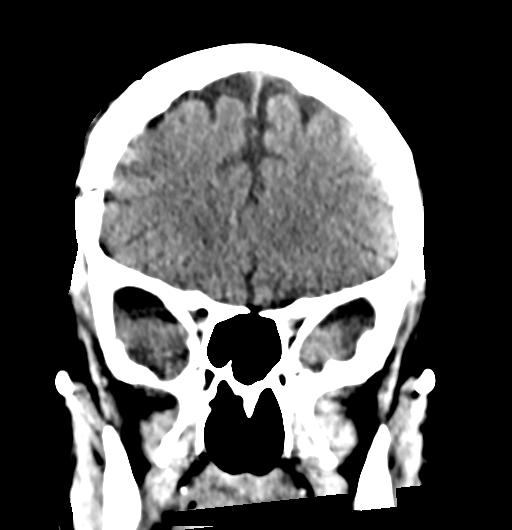
[im 44/98  brain]
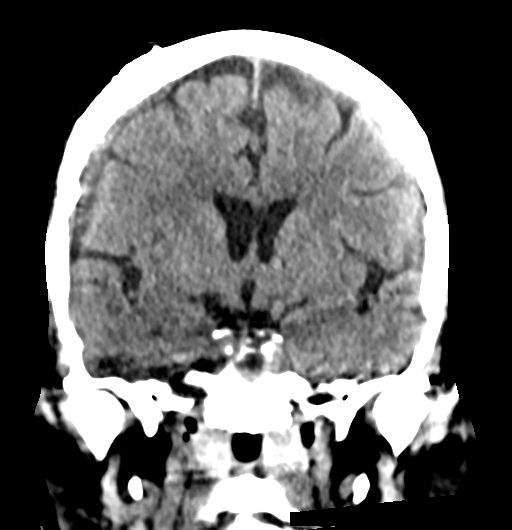
[im 54/98  brain]
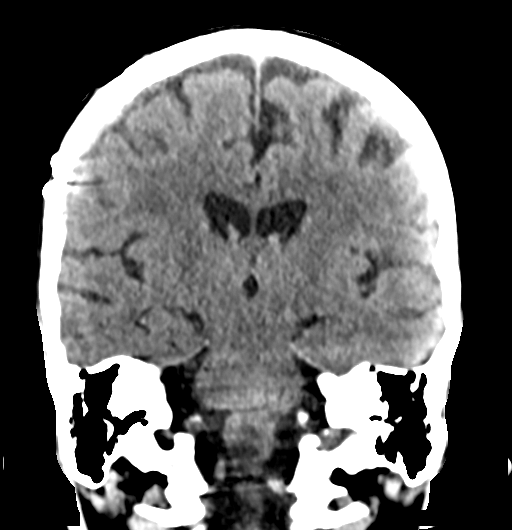

[17 of 47 positions shown; findings below may reference images not displayed]

FINDINGS: Brain: Subdural hematoma over the right cerebral convexity has
decreased in size. There is a small residual hypoattenuating
subdural collection which measures up to 7 mm in thickness in the
right parietal region without significant associated mass effect.
Pneumocephalus has resolved. No midline shift, new intracranial
hemorrhage, acute infarct, or mass is identified. Mild cerebral
atrophy is not greater than expected for age.

Vascular: Calcified atherosclerosis at the skull base. No hyperdense
vessel.

Skull: Right frontal craniotomy.

Sinuses/Orbits: Paranasal sinuses and mastoid air cells are clear.
Unremarkable orbits.

Other: None.
IMPRESSION: 1. Decreased size of right cerebral convexity subdural hematoma.
Small residual low-density subdural collection without mass effect.
2. No evidence of new intracranial abnormality.
